# Patient Record
Sex: Male | Born: 1950 | Race: White | Hispanic: No | Smoking: Never smoker
Health system: Southern US, Community
[De-identification: ages and names within clinical notes are randomized; demographics above are authoritative.]

## PROBLEM LIST (undated history)

## (undated) DIAGNOSIS — C679 Malignant neoplasm of bladder, unspecified: Secondary | ICD-10-CM

## (undated) DIAGNOSIS — I10 Essential (primary) hypertension: Secondary | ICD-10-CM

## (undated) DIAGNOSIS — I219 Acute myocardial infarction, unspecified: Secondary | ICD-10-CM

## (undated) DIAGNOSIS — I719 Aortic aneurysm of unspecified site, without rupture: Secondary | ICD-10-CM

## (undated) DIAGNOSIS — G4730 Sleep apnea, unspecified: Secondary | ICD-10-CM

## (undated) HISTORY — PX: MOUTH SURGERY: SHX715

---

## 2001-06-20 ENCOUNTER — Ambulatory Visit (HOSPITAL_COMMUNITY): Admission: RE | Admit: 2001-06-20 | Discharge: 2001-06-20 | Payer: Self-pay | Admitting: Internal Medicine

## 2001-06-20 ENCOUNTER — Encounter: Payer: Self-pay | Admitting: Internal Medicine

## 2001-08-29 HISTORY — PX: BACK SURGERY: SHX140

## 2002-09-23 ENCOUNTER — Encounter: Payer: Self-pay | Admitting: Internal Medicine

## 2002-09-23 ENCOUNTER — Ambulatory Visit (HOSPITAL_COMMUNITY): Admission: RE | Admit: 2002-09-23 | Discharge: 2002-09-23 | Payer: Self-pay | Admitting: Internal Medicine

## 2004-05-19 ENCOUNTER — Inpatient Hospital Stay (HOSPITAL_COMMUNITY): Admission: RE | Admit: 2004-05-19 | Discharge: 2004-05-21 | Payer: Self-pay | Admitting: Neurosurgery

## 2004-05-26 ENCOUNTER — Inpatient Hospital Stay (HOSPITAL_COMMUNITY): Admission: EM | Admit: 2004-05-26 | Discharge: 2004-05-29 | Payer: Self-pay | Admitting: Emergency Medicine

## 2004-05-26 ENCOUNTER — Encounter: Payer: Self-pay | Admitting: *Deleted

## 2005-02-07 ENCOUNTER — Encounter: Admission: RE | Admit: 2005-02-07 | Discharge: 2005-02-07 | Payer: Self-pay | Admitting: Neurosurgery

## 2006-06-12 IMAGING — CR DG CHEST 2V
2 series · 2 of 2 positions shown · non-contrast
Comparison: none

CLINICAL DATA: Hypotension. 
 TWO VIEW CHEST
 No previous for comparison. 
 The heart size and mediastinal contours are normal. The lungs are clear. The visualized skeleton is unremarkable.

 IMPRESSION
 No active disease.

[view not recorded (1 of 2)]
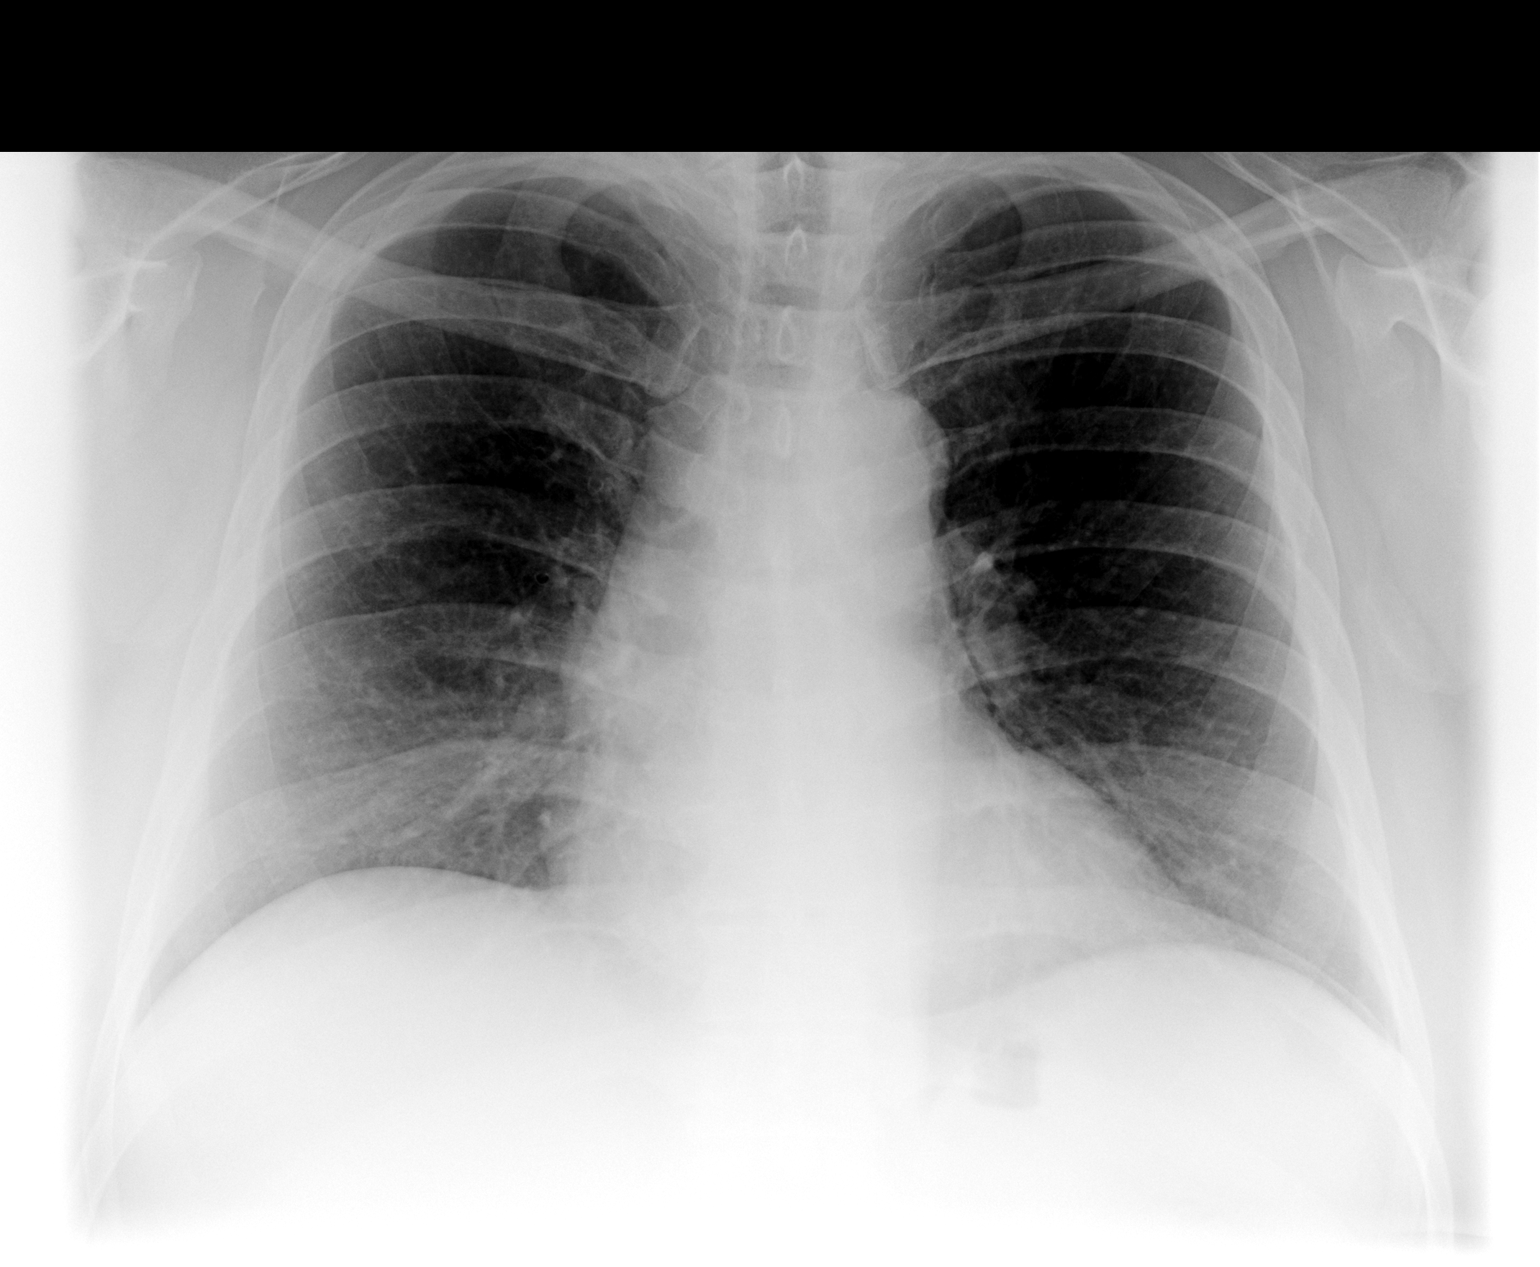

[view not recorded (2 of 2)]
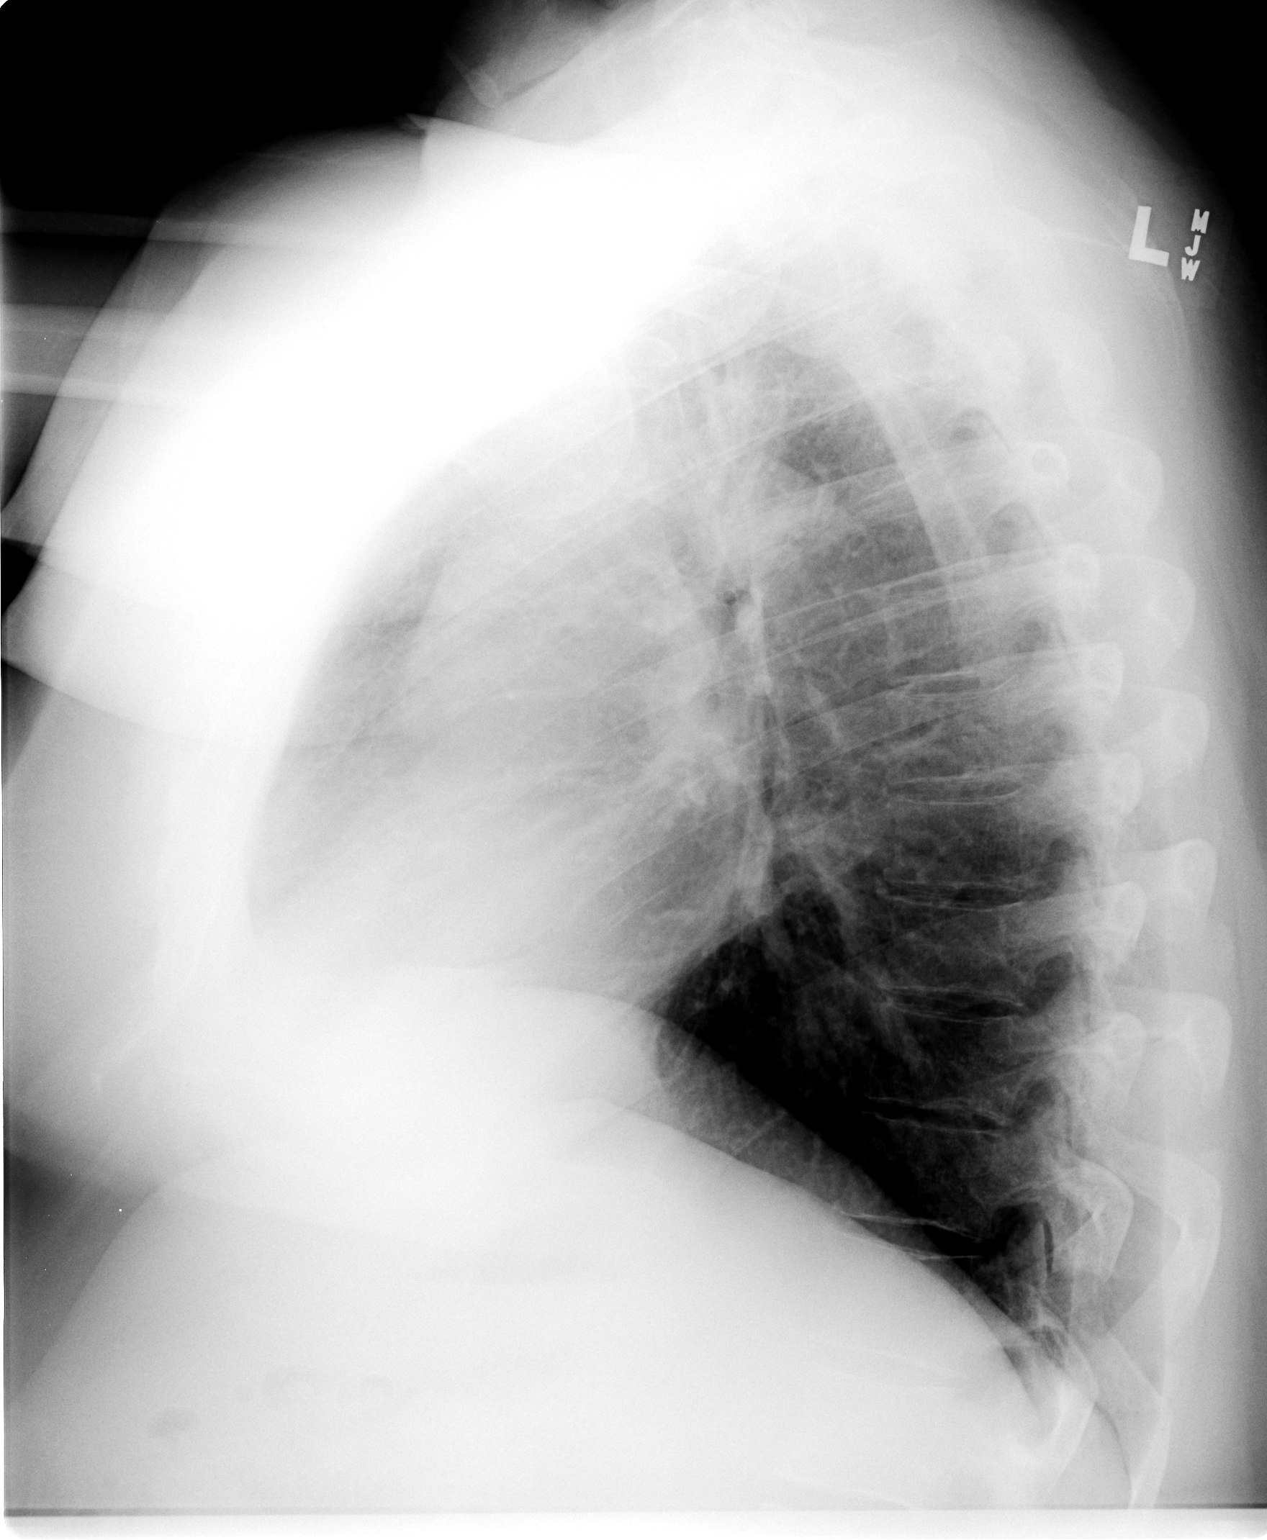

[2 of 2 positions shown; findings below may reference images not displayed]

## 2008-06-22 ENCOUNTER — Observation Stay (HOSPITAL_COMMUNITY): Admission: EM | Admit: 2008-06-22 | Discharge: 2008-06-24 | Payer: Self-pay | Admitting: Emergency Medicine

## 2008-06-23 ENCOUNTER — Ambulatory Visit: Payer: Self-pay | Admitting: Psychiatry

## 2008-06-24 ENCOUNTER — Inpatient Hospital Stay (HOSPITAL_COMMUNITY): Admission: AD | Admit: 2008-06-24 | Discharge: 2008-06-24 | Payer: Self-pay | Admitting: Psychiatry

## 2009-03-04 ENCOUNTER — Ambulatory Visit (HOSPITAL_COMMUNITY): Admission: RE | Admit: 2009-03-04 | Discharge: 2009-03-04 | Payer: Self-pay | Admitting: Internal Medicine

## 2010-09-19 ENCOUNTER — Encounter: Payer: Self-pay | Admitting: Internal Medicine

## 2011-01-04 ENCOUNTER — Ambulatory Visit (INDEPENDENT_AMBULATORY_CARE_PROVIDER_SITE_OTHER): Payer: Medicare HMO | Admitting: Internal Medicine

## 2011-01-04 DIAGNOSIS — R195 Other fecal abnormalities: Secondary | ICD-10-CM

## 2011-01-11 NOTE — H&P (Signed)
NAMEMarland Kitchen  LEDELL, CODRINGTON NO.:  [id number]   MEDICAL RECORD NO.:  [id number]          PATIENT TYPE:  INP   LOCATION:  IC08                          FACILITY:  [facility]   PHYSICIAN:  Osvaldo Shipper, MD     DATE OF BIRTH:  May 08, 1951   DATE OF ADMISSION:  06/22/2008  DATE OF DISCHARGE:  LH                              HISTORY & PHYSICAL   PRIMARY CARE PHYSICIAN:  Madelin Rear. Sherwood Gambler, M.D.   ADMISSION DIAGNOSES:  1. Intentional drug overdose with Ambien and Hydrocodone and      acetaminophen.  2. Altered mental status as a result of #1.  3. History of hypertension.  4. History of depression, not taking his medications.   CHIEF COMPLAINT:  I took sleeping pills.   HISTORY OF PRESENT ILLNESS:  The patient is a 60 year old Caucasian male  who has a past medical history of hypertension who has been unemployed  for a year and has also been depressed and says that he has been trying  to take his life with medications over the past one or two months.  According to his wife who is with him, the patient has been talking  about taking his life for the past few months as well.  He has not  sought any attention for this.  He was prescribed some Lexapro and Xanax  by his PMD but he has not been taking them.   Apparently at six o'clock this evening he chewed 4-5 tablets of Ambien  CR and swallowed them and he also chewed 3-4 tablets of Hydrocodone  5/500 and then swallowed them.  He states he has been doing the same for  the last 3-4 nights.  He said he wanted to go to sleep.  He is  complaining of some nonspecific abdominal pain in the lower abdomen  which comes and goes but denies any nausea or vomiting.   He apparently is also in the process of undergoing divorce from his wife  and he also has other stressors in his life.   MEDICATIONS AT HOME:  1. Enalapril 10 mg daily.  2. Ambien CR 12.5 mg as needed.  This prescription was filled on      October 17 with 30 tablets and the  bottle is empty.  3. The bottle of Hydrocodone is actually an expired bottle.  4. He is also supposed to be taking Lexapro and Xanax but he is not      taking them at this time.  5. He is on 162 mg of aspirin daily.  6. He is on multivitamins daily.   ALLERGIES:  No known drug allergies.   PAST MEDICAL HISTORY:  1. Positive for hypertension.  2. Depression.   PAST SURGICAL HISTORY:  Cervical spine fusion surgery.  This is  complicated by cervical hematomas which were retractably surgically  evacuated.   SOCIAL HISTORY:  He lives in [place].  He is in the process of  divorcing from his wife.  He does not smoke.  No alcohol use.  No  illicit drug use.  Currently unemployed.  He was laid off about a year  ago.   FAMILY HISTORY:  Positive for lung cancer, heart disease, and diabetes.   REVIEW OF SYSTEMS:  Difficult to do because the patient does not stay  awake much.   PHYSICAL EXAMINATION:  VITAL SIGNS:  Temperature 98.6, blood pressure  132/68, heart rate in the 80s, respiratory rate 16, saturation 99% on 2  L via nasal cannula.  GENERAL:  Obese white male, very somnolent, arouseable, can converse a  little bit, but then goes right back to sleep.  HEENT:  His pupils are equally sized, normal, not pinpoint.  No pallor,  no icterus.  Oral mucous membranes moist.  No oral lesions are noted.  NECK:  Soft and supple.  No thyromegaly is appreciated.  LUNGS:  Clear to auscultation bilaterally.  No wheezing, rales, or  rhonchi.  CARDIOVASCULAR:  S1/S2 is normal, regular.  No murmurs appreciated.  No  S3, S4, no rubs, no bruits.  ABDOMEN:  Soft.  At times he mentions some tenderness in the lower  abdomen, but real examination is not very remarkable.  Bowel sounds are  present.  EXTREMITIES:  Show no edema.  Peripheral pulses are palpable.  NEUROLOGIC:  He is very somnolent, arouseable.  No focal neurological  deficits are present.  MUSCULOSKELETAL:  Exam was unremarkable.    LABORATORY DATA:  His CBC is unremarkable.  PTT is normal.  CMET showed  a glucose of 134, AST of 41.  LFTs are normal.  His ammonia level was  20.  Lactic acid normal.  Lipase was 28.  His acetaminophen level which  was a 1-hour draw, was less than 10.  Other labs pending at this time.  Urine drug screen was positive for opiates.  Alcohol level less than 5.  UA shows more than 1.030 specific gravity, otherwise unremarkable.   No imaging studies have been done.   ASSESSMENT:  This is a 60 year old Caucasian male who has taken about 30  tablets of Ambien over the period of the last seven days and he has also  taken expired pills of Hydrocodone.  Quantity is unknown but the bottle  had originally about 30 pills.  The pill strength was 5/500.  This is an  intentional drug overdose.  The patient has been having suicidal  ideation for the past few months.  He also has a history of depression.   PLAN:  1. Drug overdose with benzos and opiates and possibly even Tylenol.      We will observe him in the ICU.  He is very somnolent.  ABG will be      checked.  ED physician has ordered Narcan drip though his pupils      are not really pinpoint.  I think his somnolence is likely a result      of benzos rather than the opiates.  Supportive measures will be      provided.  IV fluids will be given.  If he becomes apneic or does      not improve and if his ABG does not look too good he may have to be      intubated.  2. Depression with suicidal ideations.  Once he is over his acute      illness, he will need to be evaluated by a psychiatrist in an      inpatient setting.  3. Hypertension.  He is on enalapril which we will hold for now.  4. His nonspecific abdominal pain  will be evaluated using acute      abdominal series.  He could be having some cramps, maybe as a      result of the charcoal and the medications he is taking.  If his      pain does not improve or gets worse, other imaging studies may  also      be done.   DVT prophylaxis will be initiated.   Further management decisions will depend on the results of further  testing and patient's response to treatment.      Osvaldo Shipper, MD  Electronically Signed     GK/MEDQ  D:  06/22/2008  T:  06/23/2008  Job:  [id number]   cc:   Madelin Rear. Sherwood Gambler, MD  Fax: [phone]

## 2011-01-11 NOTE — Discharge Summary (Signed)
NAMEMarland Kitchen  Rodney Moreno, Rodney Moreno NO.:  [id number]   MEDICAL RECORD NO.:  [id number]          PATIENT TYPE:  INP   LOCATION:  IC08                          FACILITY:  [facility]   PHYSICIAN:  Osvaldo Shipper, MD     DATE OF BIRTH:  1950/11/06   DATE OF ADMISSION:  06/22/2008  DATE OF DISCHARGE:  10/27/2009LH                               DISCHARGE SUMMARY   PRIMARY CARE PHYSICIAN:  Madelin Rear. Sherwood Gambler, MD   DISCHARGE DIAGNOSES:  1. Intentional drug overdose with Ambien and Vicodin for suicide      attempt.  2. Suicidal ideation for the past two months.  3. Depression.  4. Transient abdominal pain, resolved.  5. History of hypertension.   HISTORY OF PRESENT ILLNESS:  Please review my H&P dictated yesterday for  details regarding the patient's present illness.   BRIEF HOSPITAL COURSE:  Briefly, this is a 60 year old Caucasian male  who was brought into the ER after he took about five tablets of Ambien  and 4-5 tablets of Vicodin.  Later, it became apparent that he had taken  about 30 tablets of Ambien in a course of 7 days.  The patient's wife  was also with him as we evaluated the patient, and apparently the  patient has been talking about taking his own life for the past couple  of months.  He has not sought any attention for this problem.  He has  been very stressed because of employment.  When the patient came here he  was very somnolent.  He was given Narcan, he was put on Narcan drip.  He  was monitored via ICU.  He started to wake up, and this morning he was  already getting much better.  He was also having some abdominal pain  which has improved.  He was thought to require inpatient psychiatric  assessment, so ACT team was called and apparently they have secured a  bed at [facility].  The patient has been treated for  the last 12 hours.  His vital signs show that his blood pressure is  running a little bit elevated throughout his course.  He has not been  on  his antihypertensives for a couple of days.  So, we will give him one  dose of Enalapril before he leaves.  His blood work has not shown any  significant abnormalities.   Because of the abdominal pain, we obtained acute abdominal series which  suggest possible mild ileus.  His symptoms have resolved, so no further  infections required.  Patient tolerated PO diet.   The patient wanted to actually go home.  I explained to him that doing  so without psychiatric assessment could be dangerous.  Next time, the  manifestations could be more severe.  So, the patient agrees to patient  assessment at this time.   CURRENT MEDICATIONS:  1. Lovenox for DVT prophylaxis.  2. Protonix daily for stress ulcer prophylaxis.   HOME MEDICATIONS:  1. Enalapril 10 mg daily.  2. Ambien ER 12.5 mg p.r.n.  3. Hydrochlorothiazide.  4. Supposed  to be taking Lexapro and Xanax, but has not been taking      them.  5. Aspirin 162 mg.  6. Multivitamin daily.   DISPOSITION:  Discharge instructions, etc, to be deferred to when the  patient goes home from [facility].   CONDITION ON DISCHARGE:  At this time, he is stable to be transferred to  [facility].      Osvaldo Shipper, MD  Electronically Signed     GK/MEDQ  D:  06/23/2008  T:  06/24/2008  Job:  [id number]   cc:   Madelin Rear. Sherwood Gambler, MD  Fax: [phone]

## 2011-01-11 NOTE — H&P (Signed)
NAMEMarland Kitchen  CHRISTINO, MCGLINCHEY NO.:  [id number]   MEDICAL RECORD NO.:  [id number]          PATIENT TYPE:  IPS   LOCATION:  0506                          [facility]   PHYSICIAN:  Geoffery Lyons, M.D.      DATE OF BIRTH:  1951/02/06   DATE OF ADMISSION:  06/24/2008  DATE OF DISCHARGE:                       PSYCHIATRIC ADMISSION ASSESSMENT   A 60 year old male involuntarily committed on June 23, 2008.   HISTORY OF PRESENT ILLNESS:  The patient is here on petition papers that  state that patient overdosed on a small amounts of his medications.  He  does state that he had chewed approximately 3 of his Ambien and had  taken some hydrocodone for recent dental procedure trying to get himself  to sleep.  Had no intention that he was trying to harm himself.  He  apparently was found by his wife who got very concerned.  She had found  him unresponsive and was taken to the Emergency Department for  evaluation.  The patient does have some stressors.  He is currently  unemployed.  There seems to be some marital conflict noted in the chart  but again denies any suicidal thoughts and states he has reason to live  for his children who he states he dearly loves.  Denies any alcohol or  substance use.   PAST PSYCHIATRIC HISTORY:  First admission to [facility].  No other hospitalizations.  No prior history of any suicide attempts.   SOCIAL HISTORY:  A 60 year old male married 27 years.  He lives in  [place].  Currently unemployed.   FAMILY HISTORY:  None.   ALCOHOL OR DRUG HISTORY:  No known alcohol or drug use.  Primary care  Aqeel Norgaard is Dr. Sherwood Gambler.   MEDICAL PROBLEMS:  Hypertension.  The patient reports recently he had a  physical where labs were within normal limits.  Blood pressure was under  better control with a weight loss and managing his diet.   MEDICATIONS:  1. Still currently on enalapril 10 mg daily.  2. Ambien CR for sleep.  3. Aspirin two 81 mg  daily prescribed by Dr. Sherwood Gambler.   DRUG ALLERGIES:  No known allergies.   PHYSICAL EXAMINATION:  GENERAL:  He is a middle-aged male.  He appears  well-nourished in no acute distress.  Fully assessed at [facility].  No significant findings on his exam.  Initially states the  patient was uncooperative and did receive some Narcan.  VITAL SIGNS:  Temperature of 98.1, 70 heart rate, 18 respirations, blood  pressure is 144/88, 246 pounds, 6 feet tall.   Urine drug screen is positive for opiates.  Alcohol level less than 5.  Urinalysis is negative.  Ammonia level was 20.  SGOT is 41.  Salicylate  level less than 4.  His acetaminophen level was less than 10.   MENTAL STATUS EXAM:  He is fully alert, cooperative, casually dressed,  good eye contact.  Speech is clear, normal pace, and tone.  The  patient's mood is neutral.  The patient's affect is pleasant, euthymic.  Does  not appear depressed.  Thought processes are coherent and goal  directed.  Denies any suicidal or homicidal thoughts.  No delusional  statements.  His cognitive function is oriented x3.  Memory is intact.  Judgment and insight appear to be fair.  Feels he could benefit from  some individual counseling.   AXIS I:  Major depressive disorder, single episode.  AXIS II:  Deferred.  AXIS III:  Hypertension.  AXIS IV:  Problems with occupation, possible other psychosocial  problems.  AXIS V:  Current is 40-45.   Plans to contract for safety, stabilize mood and thinking.  We will  contact wife for concerns and any background information.  The patient  may benefit from the IOP Program or some individual counseling.  The  patient will need to follow up with his family doctor and continue with  his current weight loss and exercise.  His tentative length of stay at  this time is 2-3 days.      Landry Corporal, N.P.      Geoffery Lyons, M.D.  Electronically Signed    JO/MEDQ  D:  06/24/2008  T:  06/24/2008  Job:   [id number]

## 2011-01-11 NOTE — Discharge Summary (Signed)
NAMEMarland Kitchen  Rodney Moreno, Rodney Moreno NO.:  [id number]   MEDICAL RECORD NO.:  [id number]          PATIENT TYPE:  IPS   LOCATION:  0506                          [facility]   PHYSICIAN:  Geoffery Lyons, M.D.      DATE OF BIRTH:  03/13/1951   DATE OF ADMISSION:  06/24/2008  DATE OF DISCHARGE:  06/24/2008                               DISCHARGE SUMMARY   IDENTIFICATION ON ADMISSION:  A 60 year old male who was involuntarily  comitted on June 23, 2008.   HISTORY OF PRESENT ILLNESS:  This is a less than 24-hour observation of  a patient who overdosed on Ambien and hydrocodone.  Again the patient is  here on petition that states the patient did overdose in small amounts  of his medications.  He was medically cleared after being admitted to  [facility].  The patient denies any suicidal thoughts stating  he had only taken the medication to help him sleep.  Records do indicate  that the patient initially was found by his wife.  Apparently, the  patient was unresponsive at home and was again assessed in the emergency  department and admitted briefly in the hospital for stabilization.  His  medications were prescribed by his family doctor and the hydrocodone was  prescribed for a dental procedure that he recently had.   PAST PSYCHIATRIC HISTORY:  This is the first admission to [facility].  Has had no other psychiatric hospitalizations.  Has no  current outpatient mental health treatment.   SOCIAL HISTORY:  This is a 60 year old married male, married for 27  years who lives in [place] with his wife, currently unemployed.   FAMILY HISTORY:  None.   ALCOHOL AND DRUG HISTORY:  Denies any alcohol or drug use.   PRIMARY CARE Benny Henrie:  Dr. Sherwood Gambler.   MEDICAL PROBLEMS:  The patient is being treated for hypertension.   MEDICATION:  1. The patient has been on enalapril 10 mg at home.  2. Ambien CR for sleep.  3. Has been taking aspirin 81 mg 2 daily.   DRUG  ALLERGIES:  No known allergies.   PHYSICAL EXAM:  CONSTITUTIONAL:  Again a middle-aged male who appears  well nourished, cooperative in no acute distress.   His laboratory data shows a urine drug screen that is positive for  opiates.  Urinalysis is negative.  Salicylate less than 4.  Acetaminophen level less than 10.  Alcohol level less than 5.  SGOT  mildly elevated at 41.   MENTAL STATUS EXAM:  He is fully alert, cooperative, good eye contact,  casually dressed.  Speech is clear, articulate.  Provides a good  history.  The patient's mood is neutral.  The patient's affect is  pleasant.  Does not appear to be overtly depressed or anxious.  Thought  processes are coherent and goal-directed.  His judgment and insight are  fair.  Memory is intact.   INITIAL DIAGNOSES:  AXIS I:  Major depressive disorder NOS.  AXIS II:  Deferred.  AXIS III:  Hypertension.  AXIS IV:  Problems with  occupation, psychosocial problems.  AXIS V:  Current is 45.   HOSPITAL COURSE:  His records were reviewed.  We spoke with the patient  about his mood and significant stressors that prompted the overdose.  We  contacted his wife for any concerns and support.  We discussed the  possibility of some on individual therapy after discharge to help with  any stress the patient was experiencing.  We did resume his  antihypertensive and his aspirin.  We felt with the wife's feeling very  comfortable with the patient's discharge that we felt safe that the  patient could go home and to follow up with his primary care Rodney Moreno.  The patient had stated that he did not want to take any more of the  Ambien.   DISCHARGE:  The patient will follow up with his primary care Rodney Moreno  for review of his Ambien.  He is also to resume his medications from Dr.  Sherwood Gambler and will be discharged to home.   DISCHARGE DIAGNOSIS:  AXIS I:  Depressive disorder NOS.  AXIS II:  Deferred.  AXIS III:  Hypertension.  AXIS IV: Possible  psychosocial problems with problems with occupation.  AXIS V:  Current is 55.      Landry Corporal, N.P.      Geoffery Lyons, M.D.  Electronically Signed    JO/MEDQ  D:  06/24/2008  T:  06/24/2008  Job:  [id number]

## 2011-01-14 NOTE — Op Note (Signed)
NAMETRENDON, ZARING NO.:  [id number]   MEDICAL RECORD NO.:  [id number]          PATIENT TYPE:  INP   LOCATION:  2899                         FACILITY:  [facility]   PHYSICIAN:  Hilda Lias, M.D.   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   PROCEDURE:  Anterior 5-6, 6/7 diskectomies with decompression of the spinal  cord, foraminotomy, interbody fusion with allograft, plates from C5 to C7,  microscope.   SURGEON:  Hilda Lias, M.D.   ASSISTANT:  Payton Doughty, M.D.   CLINICAL HISTORY:  The patient was admitted because of neck pain in relation  to weakness of both upper extremities.  X-ray show severe stenosis at the  level of 5-6 and 6-7.  Surgery was advised.  The risks were explained in the  history and physical.   PROCEDURE:  The patient was taken to the OR and the left side of the neck  was prepped with Betadine.  Incision was made through the skin and platysma  down to the cervical spine.  We found 2 areas of anterior osteophytes which  were removed.  The difficult part of the procedure was to visualize the  level.  We did at least 5 x-rays, tried to find out, and finally the last x-  ray showed that at least the needle was at the level of 4-5.  This was read  by the radiologist, Dr. Judie Petit. Ruel Favors.  Then we removed the osteophytes at  5-6 and 6-7.  The anterior ligament was also opened.  We brought the  microscope into the area.  At the level of 5-6, he had a severe case of  degenerative disk disease up to the point there was no soft disk at all.  Then with the microscope, we drilled the posterior aspects of 5 and 6 and we  opened a thick calcified ligament.  The patient has quite a bit of  spondylosis with some herniated disk  going bilaterally.  Removal was done  and decompression of the C6 nerve root was done bilaterally.  Then the same  procedure was done at the level of C7, where we found quite a bit of  stenosis.  The dura mater was flat and pushed backward.  After having good  decompression, the endplates at 5, 6 and 7 were drilled and 2 pieces of  allograft with 7-mm height were inserted followed by a plate using 5 screws.  Because of being unable to see 5-6, we decided not to do any x-ray.  Nevertheless, we have a good space between the posterior aspect of the bone  graft and the spinal cord.  From then on, the area was irrigated and  hemostasis was done.  A drain was left and the wound was closed with Vicryl  and Steri-Strips.  The patient is able to be going to his room.  EB/MEDQ  D:  05/19/2004  T:  05/20/2004  Job:  [id number]

## 2011-01-14 NOTE — Op Note (Signed)
NAMEQUENCY, TOBER NO.:  [id number]   MEDICAL RECORD NO.:  [id number]          PATIENT TYPE:  INP   LOCATION:  3113                         FACILITY:  [facility]   PHYSICIAN:  Hilda Lias, M.D.   DATE OF BIRTH:  Apr 16, 1951   DATE OF PROCEDURE:  05/27/2004  DATE OF DISCHARGE:                                 OPERATIVE REPORT   PREOPERATIVE DIAGNOSIS:  Cervical hematoma.   POSTOPERATIVE DIAGNOSIS:  Cervical hematoma.   OPERATION PERFORMED:  Evacuation of cervical hematoma.  Microscope.   SURGEON:  Hilda Lias, M.D.   ANESTHESIA:   INDICATIONS FOR PROCEDURE:  Mr. Foot is a gentleman who a week ago  underwent surgery at the level of 5-6 and 6-7.  He is over 300 pounds.  He  has a drain which was kept for 48 hours.  After that, the patient was  stable.  He went home and he had been doing fairly well until today when he  was in the [place] and he noticed swelling of the neck.  He was taken to  [facility] and from there we transferred him to [facility].  Indeed we could see some fresh bleeding coming from the neck.  CT  scan showed hematoma in the cervical area.  Because of that, we decided to  go ahead with surgery immediately.   DESCRIPTION OF PROCEDURE:  The patient was taken to the operating room and  he was intubated which was a bit difficult.  Then the neck was prepped with  Betadine.  The previous incision was opened and immediately we found quite a  bit of clot in front of and behind the sternocleidomastoid muscle.  There  was some fresh blood which was also removed.  There was no major point of  bleeding.  At the end, we irrigated the area.  We tried to dry it out.  Nevertheless there was still some oozing coming from the longus colli  muscle.  Hemostasis was done with bipolar cautery.  We waited at least 45  minutes, waiting for more hemostasis and we found that indeed there was an  area of small amount of bleeding coming from 6-7  vertebral body where he had  osteophyte right below the plate.  Then using Avitene, hemostasis was  accomplished.  Again we had good hemostasis.  Nevertheless we waited at  least 20 minutes and we irrigated with a copious amount of saline just to be  sure that there was no more bleeding.  Then Jackson-Pratt drain used to  follow the previous tract was inserted into the prevertebral area.  From  then on, the wound was closed with Vicryl and Steri-Strips.  At the end of  the procedure, the patient was awake, following commands, with a normal  strength.  He will be going to the intensive care unit.       EB/MEDQ  D:  05/27/2004  T:  05/27/2004  Job:  [id number]

## 2011-01-14 NOTE — Consult Note (Signed)
NAMEMarland Kitchen  GUNNAR, HEREFORD NO.:  [id number]   MEDICAL RECORD NO.:  [id number]          PATIENT TYPE:  INP   LOCATION:  3029                         FACILITY:  [facility]   PHYSICIAN:  Leighton Roach. Truett Perna, M.D. DATE OF BIRTH:  10/27/1950   DATE OF CONSULTATION:  05/27/2004  DATE OF DISCHARGE:                                   CONSULTATION   HEMATOLOGY CONSULTATION NOTE:   REFERRING PHYSICIAN:  Dr. Jeral Fruit.   PATIENT IDENTIFICATION:  Mr. Neville is a 60 year old, status post cervical  spine surgery on 9/21.  He presented to the emergency room on 9/28 with  swelling and bleeding at the left neck.   HISTORY OF PRESENT ILLNESS:  Mr. Rupe was referred to Dr. Jeral Fruit with neck  and shoulder pain.  An MRI scan showed cervical stenosis.   He was taken to the operating room on 9/21 and underwent an anterior C5-6  and C6-7 diskectomy with a decompression of the spinal cord, foraminotomy  and a fusion procedure between C5 and C7.   He reports improvement in the pain following surgery.  He was well until  9/28 when he noted the abrupt onset of swelling in the left neck and  bleeding from the neck dressing.  He was evaluated in the [facility]  Emergency Room and then referred to [facility].  A CT scan of the neck revealed a  hematoma in the left neck involving the sternocleidomastoid muscle and left  paralaryngeal/esophagus region.  The hematoma measured 6.5 x 5 cm.   He was taken to the operating room on 9/29 and clot was noted surrounding  the sternocleidomastoid muscle.  No major point of bleeding was noted.  Oozing along the musculature was cauterized.  A small amount of bleeding was  noted at the C6-7 vertebral body.  This was controlled with Avitene.  The  area was observed for another 20 minutes and no further bleeding was noted.  A Jackson-Pratt drain was placed.   Mr. Thrall reports improvement in his pain and arm movement following his  surgery earlier today.  He was in a warm tub  bath immediately prior to the  onset of left neck swelling and bleeding on 9/28.  He has no previous  history of bleeding.  He reports no bleeding following multiple tooth  extractions in the past or with hemorrhoid surgeries.  There is no family  history of a bleeding disorder.  He takes aspirin daily.   He reports being placed on an anti-inflammatory medication by Dr. Jeral Fruit  for approximately one week prior to the initial surgery.   PAST MEDICAL HISTORY:  1.  Hypertension.  2.  Sleep apnea.   PAST SURGICAL HISTORY:  1.  Hemorrhoid surgery on two occasions, the last was two to three years      ago.  2.  Multiple tooth extractions.   ALLERGIES:  No known drug allergies.   ADMISSION MEDICATIONS:  1.  Enalapril 10 mg every day.  2.  Aspirin 81 mg every day.  3.  Hydrocodone p.r.n.  4.  Naprosyn.  FAMILY HISTORY:  No family history of a bleeding disorder to his knowledge.  His daughter had a tonsillectomy at age 16 and developed bleeding at the  surgical site approximately six days after surgery.   SOCIAL HISTORY:  He works in an office occupation.  He lives with his wife  in [place].  He does not use tobacco.  He drinks alcohol on rare  occasions.  He has no transfusion history.   REVIEW OF SYSTEMS:  Otherwise unremarkable.   PHYSICAL EXAMINATION:  HEENT:  The oropharynx is without thrush.  No  bleeding.  NECK:  The left neck dressing is dry.  No bleeding at the JP drain site.  No  apparent neck edema.  There is a small amount of serosanguineous fluid in  the JP drain.  ABDOMEN:  Soft and nontender.  No organomegaly.  EXTREMITIES:  He moves both arms.  No leg edema.  No ecchymoses or petechiae  noted over the extremities.   LABORATORY DATA:  From 9/28, hemoglobin 14.8, platelets 375,000, white count  10.5.  ANC 6.4.  PT 13.3, PTT 29.  BUN 17, creatinine 1.0.   IMPRESSION/RECOMMENDATIONS:  Mr. Garber is a 60 year old who underwent  cervical spine surgery on 9/21 for  treatment of cervical stenosis.   He presented on 9/28 with a left neck hematoma and bleeding at the surgical  wound.   I have a low suspicion for an inherited bleeding disorder.  The PT and PTT  are normal and he has no history of excessive bleeding following tooth  extractions and hemorrhoid surgery.   Though his presentation appears unusual, the bleeding is most likely  surgically related.   He may have a degree of acquired platelet dysfunction secondary to aspirin  and nonsteroidal anti-inflammatory medications.   The differential diagnosis include von Willebrand's disease and much less  likely a rare inherited coagulation factor deficiency such as factor XIII  deficiency.   He should be observed closely for recurrent bleeding.   I will obtain a von Willebrand panel and bleeding time.       GBS/MEDQ  D:  05/27/2004  T:  05/28/2004  Job:  [id number]   cc:   Hilda Lias, M.D.  [address]  [place], [place] [address]  Fax: [phone]

## 2011-01-14 NOTE — H&P (Signed)
NAME:  COREON, SIMKINS NO.:  [id number]   MEDICAL RECORD NO.:  [id number]          PATIENT TYPE:  INP   LOCATION:  NA                           FACILITY:  [facility]   PHYSICIAN:  Hilda Lias, M.D.   DATE OF BIRTH:  [birth year]   DATE OF ADMISSION:  DATE OF DISCHARGE:                                HISTORY & PHYSICAL   Mr. Wanke is a gentleman who was seen by me in my office a few weeks ago  because of neck pain for several weeks.  The pain is mostly localized to the  shoulders, going to the chest wall.  He denies any weakness.  He also denies  any sensory changes, although once in awhile when he extends the neck, he  feels like an electrical sensation going from the neck down.  He denies any  problem with the lower extremities.  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  He had MRI and because of the findings, he wanted to proceed  with surgery.   PAST MEDICAL HISTORY:  Negative.   He is not allergic to any medication.   SOCIAL HISTORY:  Negative.   FAMILY HISTORY:  Father died of heart disease.   REVIEW OF SYSTEMS:  Positive for arm weakness, shoulder pain, blackout, skin  cancer, chest pain, high blood pressure.   PHYSICAL EXAMINATION:  HEENT:  Head, ears, nose, and throat normal.  NECK:  He is able to flex but extension produces some discomfort.  CHEST:  Lungs clear.  CARDIAC:  Heart sounds normal.  ABDOMEN:  Normal.  EXTREMITIES:  Normal pulses.  NEUROLOGIC:  Mental status normal.  Cranial nerves normal.  Strength normal.  Sensation normal.  Reflexes 1+, no Babinski.  The strength showed that I can  break easily both biceps and both wrist extensor with a normal deltoid and  normal triceps.   Cervical spine x-ray and MRI shows that he has a severe case of cervical  stenosis with the canal being 5  mm at the level of C6-7 and also quite a bit  of narrowing at the level of C5-6.   CLINICAL IMPRESSION:  Cervical stenosis C5-6, C6-7.   RECOMMENDATIONS:  The patient wants to proceed with surgery.  This procedure  would be at two-level anterior cervical diskectomy, and the risks are  infection; damage to the vertebral arteries, esophagus, trachea; stroke;  need for further surgery.       EB/MEDQ  D:  05/19/2004  T:  05/19/2004  Job:  [id number]

## 2011-01-14 NOTE — H&P (Signed)
NAMEMarland Kitchen  FARES, RAMTHUN NO.:  [id number]   MEDICAL RECORD NO.:  [id number]          PATIENT TYPE:  INP   LOCATION:  1824                         FACILITY:  [facility]   PHYSICIAN:  Hilda Lias, M.D.   DATE OF BIRTH:  08-07-1951   DATE OF ADMISSION:  05/26/2004  DATE OF DISCHARGE:                                HISTORY & PHYSICAL   Rodney Moreno is a gentleman who, on May 19, 2004, underwent __________  5-  6, 6-7 diskectomy and decompression of spinal cord with graft.  The patient  __________ , but we found that he has a long history of sleep apnea.  He was  seen by the pulmonologist who talked to him about using CPAP.  The patient  eventually was discharged on May 21, 2004.  He was doing really well.  He was supposed to be taking some pain medication and muscle relaxant.  The  patient did well but today about 10, I got a telephone call from [facility]  Emergency Room to let me know that Rodney Moreno was in the emergency room.  The  history was that he was in a [place] and he started noticing some swelling  of the neck.  He had a cervical spine x-ray which showed some swelling.  The  emergency room physician told me that it probably was safe to send him home,  but we agreed for the patient to come to [facility].  To my  surprise, when I saw  him in the emergency room, he was bleeding fresh  bright red blood from the left with quite a bit of hematoma.  A CT scan was  done.  Because of the findings, he is being admitted for immediate emergency  evacuation of a cervical hematoma.   PAST MEDICAL HISTORY:  1.  History of hypertension.  2.  Sleep apnea.  3.  Fusion a week ago.   SOCIAL HISTORY:  Negative.   FAMILY HISTORY:  Unremarkable.   PHYSICAL EXAMINATION:  GENERAL:  The patient is not in any acute distress  but he is really scared about what is going on.  NECK:  In the neck obviously, he has a large swelling with the trachea  mildly displaced to  the right side.  LUNGS:  Clear.  HEART:  Sounds normal.  ABDOMEN:  Normal.  EXTREMITIES:  Normal pulses.  NEURO:  Mental status is normal.  Cranial nerves normal.  Strength normal.  Sensation normal.   The CT scan showed that indeed he has a large hematoma right lobe of the  sternocleidomastoid muscle with __________  and some mild displacement of  the trachea.   CLINICAL IMPRESSION:  Cervical hematoma.   RECOMMENDATIONS:  I am going to take Rodney Moreno immediately for evacuation of  the hematoma.  I explained to the wife that I am really concerned because if  this happened today, I am really worried that there might be some bleeding  going on.  Nevertheless, since there is no airway obstruction we can wait  till the morning but I am  really concerned about the acute onset especially  one week after his previous surgery.  Because of that, I told him that it is  better to go ahead as soon as possible instead of delaying this.  The risk  of course are difficult intubation, unable to stop the bleeding, infection,  damage to the esophagus and the trachea.  The patient was seen by __________  and he is ready to go in the next few minutes.       EB/MEDQ  D:  05/27/2004  T:  05/27/2004  Job:  [id number]

## 2011-01-14 NOTE — Discharge Summary (Signed)
NAMEMarland Kitchen  CHADRIC, KIMBERLEY NO.:  [id number]   MEDICAL RECORD NO.:  [id number]          PATIENT TYPE:  INP   LOCATION:  3029                         FACILITY:  [facility]   PHYSICIAN:  Payton Doughty, M.D.      DATE OF BIRTH:  04-12-1951   DATE OF ADMISSION:  05/27/2004  DATE OF DISCHARGE:  05/29/2004                                 DISCHARGE SUMMARY   ADMITTING DIAGNOSIS:  Accumulation of hematoma after anterior cervical  decompression.   DISCHARGE DIAGNOSIS:  Accumulation of hematoma after anterior cervical  decompression.   OPERATIVE PROCEDURE:  Evacuation of hematoma by Dr. Hilda Lias.   COMPLICATIONS:  None.   DISCHARGE STATUS:  Alive and well.   BODY OF TEXT:  [age]-year-old gentleman whose history and physical is  recounted by Dr. Jeral Fruit.  He had an anterior cervical several days before  and developed a neck hematoma and having difficulty breathing.  He was  admitted and underwent evacuation of his hematoma.  Drain was placed.  He  was visited by hematology and did not identify any bleeding diatheses.  His  JP drainage has diminished to less than 5 mL a shift; drain was stopped  yesterday.  He is currently awake and alert with a patent airway, no  difficulty breathing or swallowing.   DISPOSITION:  He is being discharged home.   FOLLOWUP:  His followup will be with Dr. Jeral Fruit in a couple of weeks.       MWR/MEDQ  D:  05/29/2004  T:  05/30/2004  Job:  [id number]

## 2011-01-14 NOTE — Consult Note (Signed)
NAMEMarland Kitchen  Rodney Moreno, Rodney Moreno NO.:  [id number]   MEDICAL RECORD NO.:  [id number]          PATIENT TYPE:  INP   LOCATION:  3006                         FACILITY:  [facility]   PHYSICIAN:  Shan Levans, M.D. LHCDATE OF BIRTH:  1951/05/10   DATE OF CONSULTATION:  05/20/2004  DATE OF DISCHARGE:  05/21/2004                                   CONSULTATION   CHIEF COMPLAINT:  Evaluate for obstructive sleep apnea.   HISTORY OF PRESENT ILLNESS:  This is a 60 year old white male who was  admitted on May 19, 2004 for elective anterior cervical spine surgery  to correct C5-C6 spinal stenosis.  Postoperative overnight the patient has  been noted to have significant apneas with prolonged spells of apnea and  desaturation despite supplemental oxygen.  We are asked to assess for this.  The patient, in retrospect, notes for the past year he has had increasing  daytime hypersomnolence, increased snoring, witnessed apneas have increased,  increased somnolence to the point where he falls asleep while operating a  motor vehicle.  Notes increased memory loss, change in mood and affect.  He  goes to sleep at 10 p.m. and will often awake multiple times during the  night, cannot go back to sleep.  Denies any sinus complaints.  His weight  has been going up.  Denies any smoking history.  No reflux symptomatology.  Referred now for further evaluation.   PAST MEDICAL HISTORY:  Essentially negative.  Hypertension only.  No history  of cardiac disease, seizures, strokes, COPD, asthma, reflux disease.   SOCIAL HISTORY:  Patient is a nonsmoker.  Does not drink alcohol.   FAMILY HISTORY:  Noncontributory.   MEDICATIONS ON ADMISSION:  1.  Enalapril 10 mg daily.  2.  Aspirin 81 mg daily.  3.  Anti-inflammatory b.i.d.  4.  Naprosyn 500 mg.   PHYSICAL EXAMINATION:  VITAL SIGNS:  Blood pressure 166/89, pulse 82,  respirations 20, temperature 96.7, saturation 97% on 2 L.  GENERAL:  This is an obese  white male in no acute distress wearing a neck  brace with JP drains emanating from the neck and anterior chest wall.  CHEST:  Clear bilateral to auscultation and percussion.  There is no  evidence of wheeze or rhonchi.  CARDIAC:  Regular rate and rhythm with S3.  ABDOMEN:  Protuberant, nontender.  No organomegaly.  EXTREMITIES:  No clubbing, edema, or venous disease.  SKIN:  Clear.  HEENT:  Neck brace in place.  Oropharynx clear.   LABORATORY DATA:  Chest x-ray showed no active disease.  Hemoglobin 15.6,  white count 7.5.  Potassium 3.8, creatinine 0.9, blood sugar 115.   IMPRESSION:  Probable obstructive sleep apnea in a patient who is status  post C spine surgery for C5 and C6 spinal stenosis.  The sleep apnea is  exacerbated by recent general anesthesia.   RECOMMENDATIONS:  Begin Protonix 40 mg daily.  Trial of CPAP 10 cm of water  pressure h.s.  Continue oxygen therapy.  Check blood gas.  Arrange for sleep  study once the patient is out  of the hospital and will check with [facility] for the possibility of providing a CPAP device on discharge for  the patient to use at home prior to sleep study being performed.      Patr   PW/MEDQ  D:  05/20/2004  T:  05/21/2004  Job:  [id number]   cc:   Madelin Rear. Sherwood Gambler, M.D.  [address]  [place]  [place] [address]  Fax: [phone]

## 2011-01-24 NOTE — Consult Note (Signed)
Rodney Moreno, SUDER NO.:  [id number]  MEDICAL RECORD NO.:  [id number]           PATIENT TYPE:  LOCATION:                                 FACILITY:  PHYSICIAN:  Lionel December, M.D.    DATE OF BIRTH:  Jan 16, 1951  DATE OF CONSULTATION:  01/04/2011 DATE OF DISCHARGE:                                CONSULTATION   REFERRING PHYSICIAN:  Madelin Rear. Sherwood Gambler, MD  REASON FOR CONSULTATION:  Heme-positive stool.  HISTORY OF PRESENT ILLNESS:  Rodney Moreno is a 60 year old white male referred to our office by Dr. Sherwood Gambler.  He was seen by Dr. Sherwood Gambler on November 23, 2010 for a wellness male exam.  He was found to have a slightly positive stool for blood.  He states his appetite is good.  He has had no dysphagia.  No abdominal pain.  No acid reflux.  He denies sore throat.  He usually has a bowel movement 1-3 a day.  There has been no weight loss.  He denies a prior history of bright red rectal bleeding or black stools.  He has never undergone a colonoscopy in the past.  ALLERGIES:  There are no known allergies.  HOME MEDICATIONS: 1. Fish oil 1000 mg 1 a day. 2. Aspirin 81 mg a day. 3. Vasotec 10 mg a day. 4. Multivitamin 1 a day.  PAST SURGICAL HISTORY:  He has a butterfly to his fourth cervical spine which was placed 5 years ago.  He had a "blood blister" removed from his rectum.  PAST MEDICAL HISTORY:  He has a history of hypertension and he has multiple skin cancers to both arms that were recently removed about a week ago by Dr. Margo Aye in [place].  FAMILY HISTORY:  His mother deceased from CHF.  His father deceased from lung cancer.  One sister is in fair health.  Two brothers, one has a history of prostate cancer and one is in good health.  He is married. He works at [employer]  He does not smoke, drink, or do drugs and he has two children in good health.  OBJECTIVE:  VITAL SIGNS:  His weight is 227.7, his height is 5 feet 10 inches, his temperature is 98.6, his blood  pressure is 100/68, and his pulse is 76. HEENT:  He has natural teeth.  His oral mucosa is moist.  There are no lesions.  His conjunctivae are pink.  His sclerae are anicteric. NECK:  His thyroid is normal.  There is no cervical lymphadenopathy. LUNGS:  Clear. HEART:  Regular rate and rhythm. ABDOMEN:  Soft.  Bowel sounds are positive.  No masses.  No tenderness. EXTREMITIES:  There is no edema to his extremities.  ASSESSMENT:  Rodney Moreno is a 60 year old male who has heme positive stool. He has no GI symptoms and does not take NSAIDS except low dose ASA. We need to rule out occult neoplasm.  RECOMMENDATIONS:  We will schedule a colonoscopy in the near future.  The risks and benefits were reviewed with the patient.  He is agreeable.    ______________________________ Dorene Ar, NP  ______________________________ Lionel December, M.D.    TS/MEDQ  D:  01/04/2011  T:  01/05/2011  Job:  [id number]  cc:   Madelin Rear. Sherwood Gambler, MD Fax: [phone]  Electronically Signed by Dorene Ar PA on 01/06/2011 05:26:43 PM Electronically Signed by Lionel December M.D. on 01/24/2011 06:14:58 PM

## 2011-02-03 ENCOUNTER — Other Ambulatory Visit (INDEPENDENT_AMBULATORY_CARE_PROVIDER_SITE_OTHER): Payer: Self-pay | Admitting: Internal Medicine

## 2011-02-03 ENCOUNTER — Encounter (HOSPITAL_BASED_OUTPATIENT_CLINIC_OR_DEPARTMENT_OTHER): Payer: Managed Care, Other (non HMO) | Admitting: Internal Medicine

## 2011-02-03 ENCOUNTER — Ambulatory Visit (HOSPITAL_COMMUNITY)
Admission: RE | Admit: 2011-02-03 | Discharge: 2011-02-03 | Disposition: A | Discharge status: Home / Self Care | Payer: Managed Care, Other (non HMO) | Source: Ambulatory Visit | Attending: Internal Medicine | Admitting: Internal Medicine

## 2011-02-03 DIAGNOSIS — D126 Benign neoplasm of colon, unspecified: Secondary | ICD-10-CM

## 2011-02-03 DIAGNOSIS — Z79899 Other long term (current) drug therapy: Secondary | ICD-10-CM | POA: Insufficient documentation

## 2011-02-03 DIAGNOSIS — K5730 Diverticulosis of large intestine without perforation or abscess without bleeding: Secondary | ICD-10-CM

## 2011-02-03 DIAGNOSIS — Z7982 Long term (current) use of aspirin: Secondary | ICD-10-CM | POA: Insufficient documentation

## 2011-02-03 DIAGNOSIS — K921 Melena: Secondary | ICD-10-CM | POA: Insufficient documentation

## 2011-02-28 NOTE — Op Note (Signed)
  NAME:  Rodney Moreno, MARONE NO.:  [id number]  MEDICAL RECORD NO.:  [id number]  LOCATION:  DAYP                          FACILITY:  [facility]  PHYSICIAN:  Lionel December, M.D.    DATE OF BIRTH:  Jan 19, 1951  DATE OF PROCEDURE: DATE OF DISCHARGE:                              OPERATIVE REPORT   PROCEDURE:  Colonoscopy.  INDICATION:  Jamiah is 60 year old Caucasian male who recently found to have heme-positive stools.  He denies history of abdominal pain, melena, or rectal bleeding.  Family history is negative for colorectal carcinoma.  Procedures were reviewed with the patient.  Informed consent was obtained.  MEDS FOR CONSCIOUS SEDATION: 1. Demerol 50 mg IV. 2. Versed 5 mg IV.  FINDINGS:  Procedure performed in endoscopy suite.  The patient's vital signs and O2 sat were monitored during the procedure and remained stable.  The patient was placed in left recumbent position, rectal examination performed.  No abnormality noted on external or digital exam.  Pentax videoscope was placed in rectum and advanced under vision into sigmoid colon and beyond.  Preparation was excellent.  Few diverticula noted at hepatic flexure.  Scope was passed into cecum which was identified by appendiceal orifice and ileocecal valve.  Short segment of GI was also examined was normal.  As the scope was withdrawn, colonic mucosa was carefully examined.  There was 4-5 mm polyp at sigmoid colon.  This was ablated via cold biopsy.  Rest of the colonic mucosa was normal.  Rectal mucosa similarly was normal.  Scope was retroflexed to examine anorectal junction which was unremarkable. Endoscope was then withdrawn.  Withdrawal time was 15 minutes.  The patient tolerated the procedure well.  FINAL DIAGNOSES: 1. Normal terminal ileum. 2. Few small diverticula at hepatic flexure. 3. A 4-5 mm polyp ablated via cold biopsy from sigmoid colon.  RECOMMENDATIONS: 1. Standard instructions given. 2.  High-fiber diet. 3. I will be contacting patient with results of biopsy and further     recommendations.          ______________________________ Lionel December, M.D.     NR/MEDQ  D:  02/03/2011  T:  02/04/2011  Job:  [id number]  cc:   Madelin Rear. Sherwood Gambler, MD Fax: [phone]  Electronically Signed by Lionel December M.D. on 02/28/2011 12:18:48 AM

## 2011-05-30 LAB — DIFFERENTIAL
Basophils Absolute: 0
Basophils Relative: 0
Eosinophils Absolute: 0.1
Eosinophils Absolute: 0.1
Lymphocytes Relative: 24
Lymphs Abs: 1.8
Monocytes Absolute: 0.6
Monocytes Absolute: 0.6
Neutrophils Relative %: 66
Neutrophils Relative %: 70

## 2011-05-30 LAB — LIPASE, BLOOD: Lipase: 28

## 2011-05-30 LAB — PROTIME-INR
INR: 1
INR: 1.1
Prothrombin Time: 14.2

## 2011-05-30 LAB — SALICYLATE LEVEL: Salicylate Lvl: <4

## 2011-05-30 LAB — URINALYSIS, ROUTINE W REFLEX MICROSCOPIC
Glucose, UA: NEGATIVE
Ketones, ur: NEGATIVE
Protein, ur: NEGATIVE
Specific Gravity, Urine: >1.03 — ABNORMAL HIGH
Urobilinogen, UA: 0.2
pH: 6

## 2011-05-30 LAB — COMPREHENSIVE METABOLIC PANEL
ALT: 31
Albumin: 3.7
Albumin: 3.9
BUN: 17
CO2: 25
Calcium: 9.1
Chloride: 104
Chloride: 106
GFR calc Af Amer: >60
GFR calc non Af Amer: >60
GFR calc non Af Amer: >60
Potassium: 3.5
Potassium: 4.1
Sodium: 138
Sodium: 142
Total Bilirubin: 0.6
Total Bilirubin: 0.7
Total Protein: 6.2

## 2011-05-30 LAB — BLOOD GAS, ARTERIAL
Acid-Base Excess: 0.4
O2 Content: 2

## 2011-05-30 LAB — DRUG SCREEN PANEL (SERUM)
Barbiturate Scrn: NEGATIVE
Cannabinoid, Blood: NEGATIVE
Cocaine (Metabolite): NEGATIVE
Methadone (Dolophine), Serum: NEGATIVE
Opiates, Blood: NEGATIVE
Phencyclidine, Serum: NEGATIVE

## 2011-05-30 LAB — AMMONIA: Ammonia: 20

## 2011-05-30 LAB — APTT
aPTT: 29
aPTT: 32

## 2011-05-30 LAB — CBC
HCT: 42.3
HCT: 44.6
Hemoglobin: 15.3
MCHC: 34.3
MCHC: 34.4
Platelets: 232
RBC: 4.58
RBC: 4.82
RDW: 12.7
WBC: 7.3

## 2011-05-30 LAB — LACTIC ACID, PLASMA: Lactic Acid, Venous: 1

## 2011-05-30 LAB — ACETAMINOPHEN LEVEL: Acetaminophen (Tylenol), Serum: <10 — ABNORMAL LOW

## 2011-05-30 LAB — RAPID URINE DRUG SCREEN, HOSP PERFORMED: Cocaine: NOT DETECTED

## 2011-05-30 LAB — ETHANOL: Alcohol, Ethyl (B): <5

## 2020-03-14 ENCOUNTER — Ambulatory Visit
Admission: EM | Admit: 2020-03-14 | Discharge: 2020-03-14 | Disposition: A | Discharge status: Home / Self Care | Payer: 59 | Attending: Emergency Medicine | Admitting: Emergency Medicine

## 2020-03-14 ENCOUNTER — Other Ambulatory Visit: Payer: Self-pay

## 2020-03-14 ENCOUNTER — Encounter: Payer: Self-pay | Admitting: Emergency Medicine

## 2020-03-14 DIAGNOSIS — R509 Fever, unspecified: Secondary | ICD-10-CM

## 2020-03-14 DIAGNOSIS — Z1152 Encounter for screening for COVID-19: Secondary | ICD-10-CM

## 2020-03-14 MED ORDER — PREDNISONE 10 MG PO TABS
20.0000 mg | ORAL_TABLET | Freq: Every day | ORAL | 0 refills | Status: DC
Start: 2020-03-14 — End: 2020-08-25

## 2020-03-14 NOTE — Discharge Instructions (Addendum)

## 2020-03-14 NOTE — ED Triage Notes (Signed)
Headache, fever, fatigue and sore throat that started yesterday. Pt had covid vaccines.

## 2020-03-14 NOTE — ED Provider Notes (Signed)
[place]   [id number] 03/14/20 Arrival Time: 4098  Chief Complaint  Patient presents with  . Fever     SUBJECTIVE: History from: patient.  Rodney Moreno is a 69 y.o. male who presented to the urgent care for complaint of fever, fatigue, sore throat headache that started yesterday.  Reported he had Covid vaccine.  Denies precipitating event or positive sick exposure to strep, flu or Covid.  Has tried OTC tylenol/ motrin with relief.  Denies aggravating or alleviating factors.  Denies similar symptoms in the past.   Denies night sweats, decreased appetite, decreased activity, otalgia, drooling, vomiting, cough, wheezing, rash, strong urine odor, dark colored urine, changes in bowel or bladder function.     ROS: As per HPI.  All other pertinent ROS negative.     History reviewed. No pertinent past medical history. Past Surgical History:  Procedure Laterality Date  . MOUTH SURGERY     No Known Allergies No current facility-administered medications on file prior to encounter.   No current outpatient medications on file prior to encounter.   Social History   Socioeconomic History  . Marital status: Divorced    Spouse name: Not on file  . Number of children: Not on file  . Years of education: Not on file  . Highest education level: Not on file  Occupational History  . Not on file  Tobacco Use  . Smoking status: Never Smoker  . Smokeless tobacco: Never Used  Substance and Sexual Activity  . Alcohol use: Never  . Drug use: Never  . Sexual activity: Not on file  Other Topics Concern  . Not on file  Social History Narrative  . Not on file   Social Determinants of Health   Financial Resource Strain:   . Difficulty of Paying Living Expenses:   Food Insecurity:   . Worried About Charity fundraiser in the Last Year:   . Arboriculturist in the Last Year:   Transportation Needs:   . Film/video editor (Medical):   Marland Kitchen Lack of Transportation (Non-Medical):     Physical Activity:   . Days of Exercise per Week:   . Minutes of Exercise per Session:   Stress:   . Feeling of Stress :   Social Connections:   . Frequency of Communication with Friends and Family:   . Frequency of Social Gatherings with Friends and Family:   . Attends Religious Services:   . Active Member of Clubs or Organizations:   . Attends Archivist Meetings:   Marland Kitchen Marital Status:   Intimate Partner Violence:   . Fear of Current or Ex-Partner:   . Emotionally Abused:   Marland Kitchen Physically Abused:   . Sexually Abused:    No family history on file.  OBJECTIVE:  Vitals:   03/14/20 1402 03/14/20 1405  BP:  101/63  Pulse:  87  Resp:  18  Temp:  99.2 F (37.3 C)  TempSrc:  Oral  SpO2:  95%  Weight: 269 lb (122 kg)   Height: 5\' 11"  (1.803 m)      General appearance: alert; smiling and laughing during encounter; nontoxic appearance HEENT: NCAT; Ears: EACs clear, TMs pearly gray; Eyes: EOM grossly intact. Nose: no rhinorrhea without nasal flaring; Throat: oropharynx clear, tonsils not enlarged or erythematous, uvula midline Neck: supple without LAD Lungs: CTA bilaterally without adventitious breath sounds; normal respiratory effort, no belly breathing or accessory muscle use; no cough present Heart: regular rate and rhythm.  Radial pulses 2+ symmetrical bilaterally Abdomen: soft; normal active bowel sounds; nontender to palpation Skin: warm and dry; no obvious rashes Psychological: alert and cooperative; normal mood and affect appropriate for age   ASSESSMENT & PLAN:  1. Fever, unspecified   2. Encounter for screening for COVID-19     Meds ordered this encounter  Medications  . predniSONE (DELTASONE) 10 MG tablet    Sig: Take 2 tablets (20 mg total) by mouth daily.    Dispense:  15 tablet    Refill:  0     Discharge Instructions COVID testing ordered.  It will take between 2-7 days for test results.  Someone will contact you regarding abnormal results.     In the meantime: You should remain isolated in your home for 10 days from symptom onset AND greater than 24 hours after symptoms resolution (absence of fever without the use of fever-reducing medication and improvement in respiratory symptoms), whichever is longer Get plenty of rest and push fluids Use medications daily for symptom relief Use OTC medications like ibuprofen or tylenol as needed fever or pain Call or go to the ED if you have any new or worsening symptoms such as fever, worsening cough, shortness of breath, chest tightness, chest pain, turning blue, changes in mental status, etc...   Reviewed expectations re: course of current medical issues. Questions answered. Outlined signs and symptoms indicating need for more acute intervention. Patient verbalized understanding. After Visit Summary given.      Note: This document was prepared using Dragon voice recognition software and may include unintentional dictation errors.      Emerson Monte, [place] 03/14/20 1456

## 2020-03-15 LAB — NOVEL CORONAVIRUS, NAA: SARS-CoV-2, NAA: NOT DETECTED

## 2020-03-15 LAB — SARS-COV-2, NAA 2 DAY TAT

## 2020-08-25 ENCOUNTER — Other Ambulatory Visit: Payer: Self-pay

## 2020-08-25 ENCOUNTER — Ambulatory Visit
Admission: EM | Admit: 2020-08-25 | Discharge: 2020-08-25 | Disposition: A | Discharge status: Home / Self Care | Payer: 59 | Attending: Physician Assistant | Admitting: Physician Assistant

## 2020-08-25 ENCOUNTER — Ambulatory Visit (INDEPENDENT_AMBULATORY_CARE_PROVIDER_SITE_OTHER): Payer: 59

## 2020-08-25 DIAGNOSIS — M25572 Pain in left ankle and joints of left foot: Secondary | ICD-10-CM

## 2020-08-25 DIAGNOSIS — M79672 Pain in left foot: Secondary | ICD-10-CM | POA: Diagnosis not present

## 2020-08-25 HISTORY — DX: Essential (primary) hypertension: I10

## 2020-08-25 MED ORDER — PREDNISONE 50 MG PO TABS
ORAL_TABLET | ORAL | 0 refills | Status: DC
Start: 2020-08-25 — End: 2021-04-19

## 2020-08-25 NOTE — Discharge Instructions (Addendum)
You may have gout.  See your Physician for recheck in 1 week.  Go to the Emergency department if symptoms worsen or change

## 2020-08-25 NOTE — ED Triage Notes (Signed)
Pt presents with complaints of foot, ankle and greater toe swelling and pain to his left foot x 5 days. Denies any injury. Reports swelling gradually got worse this morning.

## 2020-08-27 NOTE — ED Provider Notes (Signed)
[facility]    CSN: [id number] Arrival date & time: 08/25/20  1100      History   Chief Complaint Chief Complaint  Patient presents with  . Ankle Pain    HPI Rodney Moreno is a 69 y.o. male.   The history is provided by the patient. No language interpreter was used.  Ankle Pain Location:  Ankle Time since incident:  5 days Injury: no   Pain details:    Quality:  Aching   Radiates to:  Does not radiate   Severity:  Moderate   Timing:  Constant   Progression:  Worsening Chronicity:  New Dislocation: no   Foreign body present:  Unable to specify Relieved by:  Nothing Worsened by:  Nothing Ineffective treatments:  None tried Risk factors: no known bone disorder     Past Medical History:  Diagnosis Date  . Hypertension     There are no problems to display for this patient.   Past Surgical History:  Procedure Laterality Date  . MOUTH SURGERY         Home Medications    Prior to Admission medications   Medication Sig Start Date End Date Taking? Authorizing Provider  predniSONE (DELTASONE) 50 MG tablet One tablet a day for 5 days 08/25/20  Yes Fransico Meadow, PA-C    Family History Family History  Family history unknown: Yes    Social History Social History   Tobacco Use  . Smoking status: Never Smoker  . Smokeless tobacco: Never Used  Substance Use Topics  . Alcohol use: Never  . Drug use: Never     Allergies   Patient has no known allergies.   Review of Systems Review of Systems  Musculoskeletal: Positive for joint swelling and myalgias.  All other systems reviewed and are negative.    Physical Exam Triage Vital Signs ED Triage Vitals  Enc Vitals Group     BP 08/25/20 1216 125/82     Pulse Rate 08/25/20 1216 71     Resp 08/25/20 1216 19     Temp 08/25/20 1216 98 F (36.7 C)     Temp src --      SpO2 08/25/20 1216 95 %     Weight --      Height --      Head Circumference --      Peak Flow --      Pain Score  08/25/20 1215 9     Pain Loc --      Pain Edu? --      Excl. in [place]? --    No data found.  Updated Vital Signs BP 125/82   Pulse 71   Temp 98 F (36.7 C)   Resp 19   SpO2 95%   Visual Acuity Right Eye Distance:   Left Eye Distance:   Bilateral Distance:    Right Eye Near:   Left Eye Near:    Bilateral Near:     Physical Exam Vitals and nursing note reviewed.  Constitutional:      Appearance: He is well-developed and well-nourished.  HENT:     Head: Normocephalic and atraumatic.  Cardiovascular:     Rate and Rhythm: Normal rate.  Pulmonary:     Effort: Pulmonary effort is normal.  Musculoskeletal:        General: Swelling and tenderness present. No edema.     Cervical back: Neck supple.  Skin:    General: Skin is warm and dry.  Neurological:     Mental Status: He is alert.  Psychiatric:        Mood and Affect: Mood and affect normal.      UC Treatments / Results  Labs (all labs ordered are listed, but only abnormal results are displayed) Labs Reviewed - No data to display  EKG   Radiology No results found.  Procedures Procedures (including critical care time)  Medications Ordered in UC Medications - No data to display  Initial Impression / Assessment and Plan / UC Course  I have reviewed the triage vital signs and the nursing notes.  Pertinent labs & imaging results that were available during my care of the patient were reviewed by me and considered in my medical decision making (see chart for details).     MDM:  Xray no acute abnormality, I suspect pt has gout.  Pt given rx for Prednisone  Final Clinical Impressions(s) / UC Diagnoses   Final diagnoses:  Acute left ankle pain  Foot pain, left     Discharge Instructions     You may have gout.  See your Physician for recheck in 1 week.  Go to the Emergency department if symptoms worsen or change   ED Prescriptions    Medication Sig Dispense Auth. Provider   predniSONE (DELTASONE) 50  MG tablet One tablet a day for 5 days 5 tablet Elson Areas, [place]     PDMP not reviewed this encounter.  An After Visit Summary was printed and given to the patient.    Elson Areas, [place] 08/27/20 1437

## 2021-01-14 ENCOUNTER — Encounter (INDEPENDENT_AMBULATORY_CARE_PROVIDER_SITE_OTHER): Payer: Self-pay | Admitting: *Deleted

## 2021-04-19 ENCOUNTER — Other Ambulatory Visit: Payer: Self-pay

## 2021-04-19 ENCOUNTER — Encounter: Payer: Self-pay | Admitting: Emergency Medicine

## 2021-04-19 ENCOUNTER — Ambulatory Visit
Admission: EM | Admit: 2021-04-19 | Discharge: 2021-04-19 | Disposition: A | Discharge status: Home / Self Care | Payer: 59

## 2021-04-19 DIAGNOSIS — M79675 Pain in left toe(s): Secondary | ICD-10-CM

## 2021-04-19 DIAGNOSIS — M109 Gout, unspecified: Secondary | ICD-10-CM

## 2021-04-19 MED ORDER — PREDNISONE 20 MG PO TABS
40.0000 mg | ORAL_TABLET | Freq: Every day | ORAL | 0 refills | Status: AC
Start: 2021-04-20 — End: 2021-04-25

## 2021-04-19 MED ORDER — DEXAMETHASONE SODIUM PHOSPHATE 10 MG/ML IJ SOLN
10.0000 mg | Freq: Once | INTRAMUSCULAR | Status: AC
  Administered 2021-04-19: 10 mg via INTRAMUSCULAR

## 2021-04-19 NOTE — ED Provider Notes (Signed)
[facility]    CSN: [id number] Arrival date & time: 04/19/21  1026      History   Chief Complaint No chief complaint on file.   HPI Rodney Moreno is a 70 y.o. male.   HPI Patient presents today with left great toe pain which he suspects is gout. The pain is localized to the bunion joint of the left great toe.. Denies any known injury. Endorses redness and warmth at the joint. No prior episodes of gout. Endorses pain is worst at rest or when lying down pain is experienced when sheet hits the great toe.  Past Medical History:  Diagnosis Date   Hypertension     There are no problems to display for this patient.   Past Surgical History:  Procedure Laterality Date   MOUTH SURGERY         Home Medications    Prior to Admission medications   Medication Sig Start Date End Date Taking? Authorizing Provider  enalapril (VASOTEC) 5 MG tablet Take 5 mg by mouth daily.   Yes [provider]  predniSONE (DELTASONE) 20 MG tablet Take 2 tablets (40 mg total) by mouth daily with breakfast for 5 days. 04/20/21 04/25/21 Yes Scot Jun, FNP    Family History Family History  Problem Relation Age of Onset   Cancer Father     Social History Social History   Tobacco Use   Smoking status: Never   Smokeless tobacco: Never  Substance Use Topics   Alcohol use: Never   Drug use: Never     Allergies   Patient has no known allergies.   Review of Systems Review of Systems Pertinent negatives listed in HPI   Physical Exam Triage Vital Signs ED Triage Vitals  Enc Vitals Group     BP 04/19/21 1226 129/89     Pulse Rate 04/19/21 1226 74     Resp 04/19/21 1226 20     Temp 04/19/21 1226 97.8 F (36.6 C)     Temp src --      SpO2 04/19/21 1226 96 %     Weight --      Height --      Head Circumference --      Peak Flow --      Pain Score 04/19/21 1105 9     Pain Loc --      Pain Edu? --      Excl. in [place]? --    No data found.  Updated  Vital Signs BP 129/89   Pulse 74   Temp 97.8 F (36.6 C)   Resp 20   SpO2 96%   Visual Acuity Right Eye Distance:   Left Eye Distance:   Bilateral Distance:    Right Eye Near:   Left Eye Near:    Bilateral Near:     Physical Exam General appearance: Alert, well developed, well nourished, cooperative  Head: Normocephalic, without obvious abnormality, atraumatic Respiratory: Respirations even and unlabored, normal respiratory rate Heart: Rate and rhythm normal. No gallop or murmurs noted on exam  Extremities: Right great toe erythematous with swelling  Skin: Skin color, texture, turgor normal. No rashes seen  Psych: Appropriate mood and affect. Neurologic: GCS 15, normal coordination , or normal gait  UC Treatments / Results  Labs (all labs ordered are listed, but only abnormal results are displayed) Labs Reviewed - No data to display  EKG   Radiology No results found.  Procedures Procedures (including critical care time)  Medications Ordered in UC Medications  dexamethasone (DECADRON) injection 10 mg (10 mg Intramuscular Given 04/19/21 1300)    Initial Impression / Assessment and Plan / UC Course  I have reviewed the triage vital signs and the nursing notes.  Pertinent labs & imaging results that were available during my care of the patient were reviewed by me and considered in my medical decision making (see chart for details).      Great toe secondary to Gout Decadron IM given here in clinic  Start oral prednisone tomorrow. Follow-up with PCP regarding preventative therapy if warranted Final diagnoses:  Great toe pain, left  Acute gout involving toe of left foot, unspecified cause     Discharge Instructions      Start prednisone tomorrow. You received steroid shot today.     ED Prescriptions     Medication Sig Dispense Auth. Provider   predniSONE (DELTASONE) 20 MG tablet Take 2 tablets (40 mg total) by mouth daily with breakfast for 5 days.  10 tablet Scot Jun, FNP      PDMP not reviewed this encounter.   Scot Jun, FNP 04/19/21 [phone]

## 2021-04-19 NOTE — Discharge Instructions (Addendum)
Start prednisone tomorrow. You received steroid shot today.

## 2021-04-19 NOTE — ED Triage Notes (Signed)
Left great toe red and swollen x 1 week.

## 2021-06-01 ENCOUNTER — Other Ambulatory Visit: Payer: Self-pay | Admitting: Family Medicine

## 2021-06-01 ENCOUNTER — Other Ambulatory Visit (HOSPITAL_COMMUNITY): Payer: Self-pay | Admitting: Family Medicine

## 2021-06-01 DIAGNOSIS — E041 Nontoxic single thyroid nodule: Secondary | ICD-10-CM

## 2021-06-07 ENCOUNTER — Ambulatory Visit (HOSPITAL_COMMUNITY)
Admission: RE | Admit: 2021-06-07 | Discharge: 2021-06-07 | Disposition: A | Discharge status: Home / Self Care | Payer: 59 | Source: Ambulatory Visit | Attending: Family Medicine | Admitting: Family Medicine

## 2021-06-07 ENCOUNTER — Other Ambulatory Visit: Payer: Self-pay

## 2021-06-07 DIAGNOSIS — E041 Nontoxic single thyroid nodule: Secondary | ICD-10-CM | POA: Diagnosis present

## 2021-07-07 ENCOUNTER — Other Ambulatory Visit: Payer: Self-pay | Admitting: Internal Medicine

## 2021-07-07 DIAGNOSIS — E042 Nontoxic multinodular goiter: Secondary | ICD-10-CM

## 2021-07-13 NOTE — Progress Notes (Deleted)
      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  Past Medical History:  Diagnosis Date   Hypertension    Surgical History: Past Surgical History:  Procedure Laterality Date   MOUTH SURGERY     Family History: Family History  Problem Relation Age of Onset   Cancer Father     Social History Social History   Tobacco Use   Smoking status: Never   Smokeless tobacco: Never  Substance Use Topics   Alcohol use: Never   Drug use: Never    Current Outpatient Medications  Medication Sig Dispense Refill   enalapril (VASOTEC) 5 MG tablet Take 5 mg by mouth daily.    Vital Signs:  Allergies: No Known Allergies   Physical Exam: CV Pulmonary Abdomen Extremities   Diagnostic Tests:   Impression and Plan: We discussed the importance of strict BP control. We discussed avoidance Of fluoroquinolone antibiotics as there has been studies that have shown An increase risk of rupture/dissection.     Nani Skillern, PA-C [facility] [phone]

## 2021-08-12 ENCOUNTER — Ambulatory Visit
Admission: RE | Admit: 2021-08-12 | Discharge: 2021-08-12 | Disposition: A | Discharge status: Home / Self Care | Payer: 59 | Source: Ambulatory Visit | Attending: Internal Medicine | Admitting: Internal Medicine

## 2021-08-12 ENCOUNTER — Other Ambulatory Visit (HOSPITAL_COMMUNITY)
Admission: RE | Admit: 2021-08-12 | Discharge: 2021-08-12 | Disposition: A | Discharge status: Home / Self Care | Payer: 59 | Source: Ambulatory Visit | Attending: Radiology | Admitting: Radiology

## 2021-08-12 DIAGNOSIS — E042 Nontoxic multinodular goiter: Secondary | ICD-10-CM

## 2021-08-12 DIAGNOSIS — E041 Nontoxic single thyroid nodule: Secondary | ICD-10-CM | POA: Diagnosis present

## 2021-08-13 LAB — CYTOLOGY - NON PAP

## 2021-11-02 DIAGNOSIS — C44219 Basal cell carcinoma of skin of left ear and external auricular canal: Secondary | ICD-10-CM | POA: Diagnosis not present

## 2021-11-02 DIAGNOSIS — L821 Other seborrheic keratosis: Secondary | ICD-10-CM | POA: Diagnosis not present

## 2021-11-02 DIAGNOSIS — L570 Actinic keratosis: Secondary | ICD-10-CM | POA: Diagnosis not present

## 2021-11-02 DIAGNOSIS — Z85828 Personal history of other malignant neoplasm of skin: Secondary | ICD-10-CM | POA: Diagnosis not present

## 2021-11-02 DIAGNOSIS — L718 Other rosacea: Secondary | ICD-10-CM | POA: Diagnosis not present

## 2021-12-17 DIAGNOSIS — I10 Essential (primary) hypertension: Secondary | ICD-10-CM | POA: Diagnosis not present

## 2021-12-17 DIAGNOSIS — E782 Mixed hyperlipidemia: Secondary | ICD-10-CM | POA: Diagnosis not present

## 2021-12-17 DIAGNOSIS — Z0001 Encounter for general adult medical examination with abnormal findings: Secondary | ICD-10-CM | POA: Diagnosis not present

## 2021-12-17 DIAGNOSIS — Z1331 Encounter for screening for depression: Secondary | ICD-10-CM | POA: Diagnosis not present

## 2021-12-17 DIAGNOSIS — I712 Thoracic aortic aneurysm, without rupture, unspecified: Secondary | ICD-10-CM | POA: Diagnosis not present

## 2021-12-17 DIAGNOSIS — E6609 Other obesity due to excess calories: Secondary | ICD-10-CM | POA: Diagnosis not present

## 2021-12-17 DIAGNOSIS — Z6841 Body Mass Index (BMI) 40.0 and over, adult: Secondary | ICD-10-CM | POA: Diagnosis not present

## 2021-12-21 ENCOUNTER — Other Ambulatory Visit: Payer: Self-pay | Admitting: Internal Medicine

## 2021-12-21 ENCOUNTER — Other Ambulatory Visit (HOSPITAL_COMMUNITY): Payer: Self-pay | Admitting: Internal Medicine

## 2021-12-21 DIAGNOSIS — I712 Thoracic aortic aneurysm, without rupture, unspecified: Secondary | ICD-10-CM

## 2022-01-25 ENCOUNTER — Ambulatory Visit (HOSPITAL_COMMUNITY): Payer: 59

## 2022-01-25 ENCOUNTER — Encounter (HOSPITAL_COMMUNITY): Payer: Self-pay

## 2022-02-01 DIAGNOSIS — R310 Gross hematuria: Secondary | ICD-10-CM | POA: Diagnosis not present

## 2022-02-01 DIAGNOSIS — R319 Hematuria, unspecified: Secondary | ICD-10-CM | POA: Diagnosis not present

## 2022-02-01 DIAGNOSIS — M4317 Spondylolisthesis, lumbosacral region: Secondary | ICD-10-CM | POA: Diagnosis not present

## 2022-02-01 DIAGNOSIS — K760 Fatty (change of) liver, not elsewhere classified: Secondary | ICD-10-CM | POA: Diagnosis not present

## 2022-02-02 DIAGNOSIS — R319 Hematuria, unspecified: Secondary | ICD-10-CM | POA: Diagnosis not present

## 2022-02-02 DIAGNOSIS — N3289 Other specified disorders of bladder: Secondary | ICD-10-CM | POA: Diagnosis not present

## 2022-02-02 DIAGNOSIS — I712 Thoracic aortic aneurysm, without rupture, unspecified: Secondary | ICD-10-CM | POA: Diagnosis not present

## 2022-02-02 DIAGNOSIS — I10 Essential (primary) hypertension: Secondary | ICD-10-CM | POA: Diagnosis not present

## 2022-02-16 ENCOUNTER — Other Ambulatory Visit: Payer: Self-pay | Admitting: Urology

## 2022-02-16 ENCOUNTER — Ambulatory Visit (INDEPENDENT_AMBULATORY_CARE_PROVIDER_SITE_OTHER): Payer: 59 | Admitting: Urology

## 2022-02-16 VITALS — BP 117/77 | HR 98

## 2022-02-16 DIAGNOSIS — D494 Neoplasm of unspecified behavior of bladder: Secondary | ICD-10-CM | POA: Diagnosis not present

## 2022-02-16 DIAGNOSIS — R310 Gross hematuria: Secondary | ICD-10-CM | POA: Diagnosis not present

## 2022-02-16 LAB — URINALYSIS, ROUTINE W REFLEX MICROSCOPIC
Bilirubin, UA: NEGATIVE
Glucose, UA: NEGATIVE
Ketones, UA: NEGATIVE
Leukocytes,UA: NEGATIVE
Nitrite, UA: NEGATIVE
RBC, UA: NEGATIVE
Specific Gravity, UA: 1.02 (ref 1.005–1.030)
Urobilinogen, Ur: 0.2 mg/dL (ref 0.2–1.0)
pH, UA: 5.5 (ref 5.0–7.5)

## 2022-02-16 NOTE — Progress Notes (Unsigned)
post void residual=0 ?

## 2022-02-16 NOTE — Progress Notes (Unsigned)
Surgical Physician Order Form [facility]  * Scheduling expectation :  02/24/2022  *Length of Case: 30 minutes  *MD Preforming Case: Nicolette Bang, MD  *Assistant Needed: no  *Facility Preference: [facility]  *Clearance needed: no  *Anticoagulation Instructions: Hold all anticoagulants  *Aspirin Instructions: Ok to continue Aspirin  -Admit type: OUTpatient  -Anesthesia: General  -Use Standing Orders:  NA  *Diagnosis: Bladder Tumor 3cm  *Procedure:     TURBT 2-5cm (47096) bilateral retrogrades  Additional orders: Gemcitabine '2000mg'$  bladder instillation  -Equipment: C-Arm -VTE Prophylaxis Standing Order SCD's       Other:   -Standing Lab Orders Per Anesthesia    Lab other: None  -Standing Test orders EKG/Chest x-ray per Anesthesia       Test other:   - Medications:  Ancef 2gm IV  -Other orders:  Ok to proceed with Ancef PCN allergy reviewed  *Post-op visit Date/Instructions:  1 week voiding trial

## 2022-02-16 NOTE — Progress Notes (Unsigned)
02/16/2022 2:24 PM   Junie Panning 01/25/1951 [id number]  Referring provider: Redmond School, [place] [place],  [place] [address]  Gross hematuria   HPI: Mr Dhawan is a 71yo here for evaluation of gross hematuria. 2 months ago he developed multiple episodes of gross painless hematuria. He underwent CT in [place] which showed a 2cm bladder tumor.    PMH: Past Medical History:  Diagnosis Date   Hypertension     Surgical History: Past Surgical History:  Procedure Laterality Date   MOUTH SURGERY      Home Medications:  Allergies as of 02/16/2022   No Known Allergies      Medication List        Accurate as of February 16, 2022  2:24 PM. If you have any questions, ask your nurse or doctor.          enalapril 5 MG tablet Commonly known as: VASOTEC Take 5 mg by mouth daily.   ezetimibe 10 MG tablet Commonly known as: ZETIA Take 10 mg by mouth daily.   sildenafil 20 MG tablet Commonly known as: REVATIO Take 100 mg by mouth daily.        Allergies: No Known Allergies  Family History: Family History  Problem Relation Age of Onset   Cancer Father     Social History:  reports that he has never smoked. He has never used smokeless tobacco. He reports that he does not drink alcohol and does not use drugs.  ROS: All other review of systems were reviewed and are negative except what is noted above in HPI  Physical Exam: BP 117/77   Pulse 98   Constitutional:  Alert and oriented, No acute distress. HEENT: [place] AT, moist mucus membranes.  Trachea midline, no masses. Cardiovascular: No clubbing, cyanosis, or edema. Respiratory: Normal respiratory effort, no increased work of breathing. GI: Abdomen is soft, nontender, nondistended, no abdominal masses GU: No CVA tenderness.  Lymph: No cervical or inguinal lymphadenopathy. Skin: No rashes, bruises or suspicious lesions. Neurologic: Grossly intact, no focal deficits, moving all 4  extremities. Psychiatric: Normal mood and affect.  Laboratory Data: Lab Results  Component Value Date   WBC 7.3 06/23/2008   HGB 14.5 06/23/2008   HCT 42.3 06/23/2008   MCV 92.4 06/23/2008   PLT 232 06/23/2008    Lab Results  Component Value Date   CREATININE 1.03 06/23/2008    No results found for: "PSA"  No results found for: "TESTOSTERONE"  No results found for: "HGBA1C"  Urinalysis    Component Value Date/Time   COLORURINE YELLOW 06/22/2008 1924   APPEARANCEUR CLEAR 06/22/2008 1924   LABSPEC >1.030 (H) 06/22/2008 1924   PHURINE 6.0 06/22/2008 1924   GLUCOSEU NEGATIVE 06/22/2008 [place] NEGATIVE 06/22/2008 [place] NEGATIVE 06/22/2008 [place] 06/22/2008 1924   PROTEINUR NEGATIVE 06/22/2008 1924   UROBILINOGEN 0.2 06/22/2008 1924   NITRITE NEGATIVE 06/22/2008 1924   LEUKOCYTESUR  06/22/2008 1924    NEGATIVE MICROSCOPIC NOT DONE ON URINES WITH NEGATIVE PROTEIN, BLOOD, LEUKOCYTES, NITRITE, OR GLUCOSE <1000 mg/dL.    No results found for: "LABMICR", "WBCUA", "RBCUA", "LABEPIT", "MUCUS", "BACTERIA"  Pertinent Imaging: *** No results found for this or any previous visit.  No results found for this or any previous visit.  No results found for this or any previous visit.  No results found for this or any previous visit.  No results found for this or any previous visit.  No results  found for this or any previous visit.  No results found for this or any previous visit.  No results found for this or any previous visit.   Assessment & Plan:    1. Gross hematuria *** - BLADDER SCAN AMB NON-IMAGING - Urinalysis, Routine w reflex microscopic   No follow-ups on file.  Nicolette Bang, MD  [facility] [place]

## 2022-02-16 NOTE — H&P (View-Only) (Signed)
02/16/2022 2:24 PM   Rodney Moreno 09/07/1950 [id number]  Referring provider: Redmond School, [place] [place],  [place] [address]  Gross hematuria   HPI: Rodney Moreno is a 71yo here for evaluation of gross hematuria. 2 months ago he developed multiple episodes of gross painless hematuria. He underwent CT in [place] which showed a 2cm bladder tumor.    PMH: Past Medical History:  Diagnosis Date   Hypertension     Surgical History: Past Surgical History:  Procedure Laterality Date   MOUTH SURGERY      Home Medications:  Allergies as of 02/16/2022   No Known Allergies      Medication List        Accurate as of February 16, 2022  2:24 PM. If you have any questions, ask your nurse or doctor.          enalapril 5 MG tablet Commonly known as: VASOTEC Take 5 mg by mouth daily.   ezetimibe 10 MG tablet Commonly known as: ZETIA Take 10 mg by mouth daily.   sildenafil 20 MG tablet Commonly known as: REVATIO Take 100 mg by mouth daily.        Allergies: No Known Allergies  Family History: Family History  Problem Relation Age of Onset   Cancer Father     Social History:  reports that he has never smoked. He has never used smokeless tobacco. He reports that he does not drink alcohol and does not use drugs.  ROS: All other review of systems were reviewed and are negative except what is noted above in HPI  Physical Exam: BP 117/77   Pulse 98   Constitutional:  Alert and oriented, No acute distress. HEENT: [place] AT, moist mucus membranes.  Trachea midline, no masses. Cardiovascular: No clubbing, cyanosis, or edema. Respiratory: Normal respiratory effort, no increased work of breathing. GI: Abdomen is soft, nontender, nondistended, no abdominal masses GU: No CVA tenderness.  Lymph: No cervical or inguinal lymphadenopathy. Skin: No rashes, bruises or suspicious lesions. Neurologic: Grossly intact, no focal deficits, moving all 4  extremities. Psychiatric: Normal mood and affect.  Laboratory Data: Lab Results  Component Value Date   WBC 7.3 06/23/2008   HGB 14.5 06/23/2008   HCT 42.3 06/23/2008   MCV 92.4 06/23/2008   PLT 232 06/23/2008    Lab Results  Component Value Date   CREATININE 1.03 06/23/2008    No results found for: "PSA"  No results found for: "TESTOSTERONE"  No results found for: "HGBA1C"  Urinalysis    Component Value Date/Time   COLORURINE YELLOW 06/22/2008 1924   APPEARANCEUR CLEAR 06/22/2008 1924   LABSPEC >1.030 (H) 06/22/2008 1924   PHURINE 6.0 06/22/2008 1924   GLUCOSEU NEGATIVE 06/22/2008 [place] NEGATIVE 06/22/2008 [place] NEGATIVE 06/22/2008 [place] 06/22/2008 1924   PROTEINUR NEGATIVE 06/22/2008 1924   UROBILINOGEN 0.2 06/22/2008 1924   NITRITE NEGATIVE 06/22/2008 1924   LEUKOCYTESUR  06/22/2008 1924    NEGATIVE MICROSCOPIC NOT DONE ON URINES WITH NEGATIVE PROTEIN, BLOOD, LEUKOCYTES, NITRITE, OR GLUCOSE <1000 mg/dL.    No results found for: "LABMICR", "WBCUA", "RBCUA", "LABEPIT", "MUCUS", "BACTERIA"  Pertinent Imaging:  No results found for this or any previous visit.  No results found for this or any previous visit.  No results found for this or any previous visit.  No results found for this or any previous visit.  No results found for this or any previous visit.  No results  found for this or any previous visit.  No results found for this or any previous visit.  No results found for this or any previous visit.   Assessment & Plan:    1. Gross hematuria -likely related to large bladder tumor - BLADDER SCAN AMB NON-IMAGING - Urinalysis, Routine w reflex microscopic   No follow-ups on file.  Nicolette Bang, MD  [facility] [place]    Cystoscopy Procedure Note  Patient identification was confirmed, informed consent was obtained, and patient was prepped using Betadine solution.  Lidocaine jelly was  administered per urethral meatus.     Pre-Procedure: - Inspection reveals a normal caliber ureteral meatus.  Procedure: The flexible cystoscope was introduced without difficulty - No urethral strictures/lesions are present. 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 Risks/benefits/alternatives discussed   No follow-ups on file.  Nicolette Bang, MD

## 2022-02-21 ENCOUNTER — Other Ambulatory Visit: Payer: Self-pay

## 2022-02-22 ENCOUNTER — Encounter: Payer: Self-pay | Admitting: Urology

## 2022-02-24 NOTE — Progress Notes (Signed)
Surgery scheduled with patient.

## 2022-02-25 NOTE — Patient Instructions (Signed)
Your procedure is scheduled on: 03/03/2022  Report to [facility] at   7:30  AM.  Call this number if you have problems the morning of surgery: [phone]   Remember:   Do not Eat or Drink after midnight         No Smoking the morning of surgery  :  Take these medicines the morning of surgery with A SIP OF WATER: Ezetimibe and Allopurinol   Do not wear jewelry, make-up or nail polish.  Do not wear lotions, powders, or perfumes. You may wear deodorant.  Do not shave 48 hours prior to surgery. Men may shave face and neck.  Do not bring valuables to the hospital.  Contacts, dentures or bridgework may not be worn into surgery.  Leave suitcase in the car. After surgery it may be brought to your room.  For patients admitted to the hospital, checkout time is 11:00 AM the day of discharge.   Patients discharged the day of surgery will not be allowed to drive home.    Special Instructions: Shower using CHG night before surgery and shower the day of surgery use CHG.  Use special wash - you have one bottle of CHG for all showers.  You should use approximately 1/2 of the bottle for each shower.  How to Use Chlorhexidine for Bathing Chlorhexidine gluconate (CHG) is a germ-killing (antiseptic) solution that is used to clean the skin. It can get rid of the bacteria that normally live on the skin and can keep them away for about 24 hours. To clean your skin with CHG, you may be given: A CHG solution to use in the shower or as part of a sponge bath. A prepackaged cloth that contains CHG. Cleaning your skin with CHG may help lower the risk for infection: While you are staying in the intensive care unit of the hospital. If you have a vascular access, such as a central line, to provide short-term or long-term access to your veins. If you have a catheter to drain urine from your bladder. If you are on a ventilator. A ventilator is a machine that helps you breathe by moving air in and out of  your lungs. After surgery. What are the risks? Risks of using CHG include: A skin reaction. Hearing loss, if CHG gets in your ears and you have a perforated eardrum. Eye injury, if CHG gets in your eyes and is not rinsed out. The CHG product catching fire. Make sure that you avoid smoking and flames after applying CHG to your skin. Do not use CHG: If you have a chlorhexidine allergy or have previously reacted to chlorhexidine. On babies younger than 25 months of age. How to use CHG solution Use CHG only as told by your health care provider, and follow the instructions on the label. Use the full amount of CHG as directed. Usually, this is one bottle. During a shower Follow these steps when using CHG solution during a shower (unless your health care provider gives you different instructions): Start the shower. Use your normal soap and shampoo to wash your face and hair. Turn off the shower or move out of the shower stream. Pour the CHG onto a clean washcloth. Do not use any type of brush or rough-edged sponge. Starting at your neck, lather your body down to your toes. Make sure you follow these instructions: If you will be having surgery, pay special attention to the part of your body where you will be having  surgery. Scrub this area for at least 1 minute. Do not use CHG on your head or face. If the solution gets into your ears or eyes, rinse them well with water. Avoid your genital area. Avoid any areas of skin that have broken skin, cuts, or scrapes. Scrub your back and under your arms. Make sure to wash skin folds. Let the lather sit on your skin for 1-2 minutes or as long as told by your health care provider. Thoroughly rinse your entire body in the shower. Make sure that all body creases and crevices are rinsed well. Dry off with a clean towel. Do not put any substances on your body afterward--such as powder, lotion, or perfume--unless you are told to do so by your health care  provider. Only use lotions that are recommended by the manufacturer. Put on clean clothes or pajamas. If it is the night before your surgery, sleep in clean sheets.  During a sponge bath Follow these steps when using CHG solution during a sponge bath (unless your health care provider gives you different instructions): Use your normal soap and shampoo to wash your face and hair. Pour the CHG onto a clean washcloth. Starting at your neck, lather your body down to your toes. Make sure you follow these instructions: If you will be having surgery, pay special attention to the part of your body where you will be having surgery. Scrub this area for at least 1 minute. Do not use CHG on your head or face. If the solution gets into your ears or eyes, rinse them well with water. Avoid your genital area. Avoid any areas of skin that have broken skin, cuts, or scrapes. Scrub your back and under your arms. Make sure to wash skin folds. Let the lather sit on your skin for 1-2 minutes or as long as told by your health care provider. Using a different clean, wet washcloth, thoroughly rinse your entire body. Make sure that all body creases and crevices are rinsed well. Dry off with a clean towel. Do not put any substances on your body afterward--such as powder, lotion, or perfume--unless you are told to do so by your health care provider. Only use lotions that are recommended by the manufacturer. Put on clean clothes or pajamas. If it is the night before your surgery, sleep in clean sheets. How to use CHG prepackaged cloths Only use CHG cloths as told by your health care provider, and follow the instructions on the label. Use the CHG cloth on clean, dry skin. Do not use the CHG cloth on your head or face unless your health care provider tells you to. When washing with the CHG cloth: Avoid your genital area. Avoid any areas of skin that have broken skin, cuts, or scrapes. Before surgery Follow these steps  when using a CHG cloth to clean before surgery (unless your health care provider gives you different instructions): Using the CHG cloth, vigorously scrub the part of your body where you will be having surgery. Scrub using a back-and-forth motion for 3 minutes. The area on your body should be completely wet with CHG when you are done scrubbing. Do not rinse. Discard the cloth and let the area air-dry. Do not put any substances on the area afterward, such as powder, lotion, or perfume. Put on clean clothes or pajamas. If it is the night before your surgery, sleep in clean sheets.  For general bathing Follow these steps when using CHG cloths for general bathing (unless your health  care provider gives you different instructions). Use a separate CHG cloth for each area of your body. Make sure you wash between any folds of skin and between your fingers and toes. Wash your body in the following order, switching to a new cloth after each step: The front of your neck, shoulders, and chest. Both of your arms, under your arms, and your hands. Your stomach and groin area, avoiding the genitals. Your right leg and foot. Your left leg and foot. The back of your neck, your back, and your buttocks. Do not rinse. Discard the cloth and let the area air-dry. Do not put any substances on your body afterward--such as powder, lotion, or perfume--unless you are told to do so by your health care provider. Only use lotions that are recommended by the manufacturer. Put on clean clothes or pajamas. Contact a health care provider if: Your skin gets irritated after scrubbing. You have questions about using your solution or cloth. You swallow any chlorhexidine. Call your local poison control center (1-516-703-7063 in the [place] Get help right away if: Your eyes itch badly, or they become very red or swollen. Your skin itches badly and is red or swollen. Your hearing changes. You have trouble seeing. You have swelling or  tingling in your mouth or throat. You have trouble breathing. These symptoms may represent a serious problem that is an emergency. Do not wait to see if the symptoms will go away. Get medical help right away. Call your local emergency services (911 in the [place] Do not drive yourself to the hospital. Summary Chlorhexidine gluconate (CHG) is a germ-killing (antiseptic) solution that is used to clean the skin. Cleaning your skin with CHG may help to lower your risk for infection. You may be given CHG to use for bathing. It may be in a bottle or in a prepackaged cloth to use on your skin. Carefully follow your health care provider's instructions and the instructions on the product label. Do not use CHG if you have a chlorhexidine allergy. Contact your health care provider if your skin gets irritated after scrubbing. This information is not intended to replace advice given to you by your health care provider. Make sure you discuss any questions you have with your health care provider. Document Revised: 10/26/2020 Document Reviewed: 10/26/2020 Elsevier Patient Education  [place]. Transurethral Resection of Bladder Tumor, Care After The following information offers guidance on how to care for yourself after your procedure. Your health care provider may also give you more specific instructions. If you have problems or questions, contact your health care provider. What can I expect after the procedure? After the procedure, it is common to have: A small amount of blood or small blood clots in your urine for up to 2 weeks. Soreness or mild pain from your catheter. After your catheter is removed, you may have mild soreness, especially when urinating. A need to urinate often. Pain in your lower abdomen. Follow these instructions at home: Medicines  Take over-the-counter and prescription medicines only as told by your health care provider. If you were prescribed an antibiotic medicine, take it  as told by your health care provider. Do not stop taking the antibiotic even if you start to feel better. Ask your health care provider if the medicine prescribed to you: Requires you to avoid driving or using machinery. Can cause constipation. You may need to take these actions to prevent or treat constipation: Drink enough fluid to keep your urine pale yellow. Take  over-the-counter or prescription medicines. Eat foods that are high in fiber, such as beans, whole grains, and fresh fruits and vegetables. Limit foods that are high in fat and processed sugars, such as fried or sweet foods. Activity  If you were given a sedative during the procedure, it can affect you for several hours. Do not drive or operate machinery until your health care provider says that it is safe. Rest as told by your health care provider. Avoid sitting for a long time without moving. Get up to take short walks every 1-2 hours. This is important to improve blood flow and breathing. Ask for help if you feel weak or unsteady. Do not lift anything that is heavier than 10 lb (4.5 kg), or the limit that you are told, until your health care provider says that it is safe. Avoid intense physical activity for as long as told by your health care provider. Do not have sex until your health care provider approves. Return to your normal activities as told by your health care provider. Ask your health care provider what activities are safe for you. General instructions If you have a catheter, follow instructions from your health care provider about caring for your catheter and your drainage bag. Do not drink alcohol for as long as told by your health care provider. This is especially important if you are taking prescription pain medicines. Do not use any products that contain nicotine or tobacco. These products include cigarettes, chewing tobacco, and vaping devices, such as e-cigarettes. If you need help quitting, ask your health care  provider. Wear compression stockings as told by your health care provider. These stockings help to prevent blood clots and reduce swelling in your legs. Keep all follow-up visits. This is important. You will need to be followed closely with regular checks of your bladder and urethra (cystoscopies) to make sure that the cancer does not come back. Contact a health care provider if: You have blood in your urine for more than 2 weeks. You become constipated. Signs of constipation may include: Having fewer than three bowel movements in a week. Difficulty having a bowel movement. Stools that are dry, hard, or larger than normal. You have a urinary catheter in place, and you have: Spasms or pain. Problems with your catheter or your catheter is blocked. Your catheter has been taken out but you are unable to urinate. You have signs of infection, such as: Fever or chills. Cloudy or bad-smelling urine. Get help right away if: You have severe abdominal pain that gets worse or does not improve with medicine. You have a lot of large blood clots in your urine. You develop swelling or pain in your leg. You have difficulty breathing. These symptoms may be an emergency. Get help right away. Call 911. Do not wait to see if the symptoms will go away. Do not drive yourself to the hospital. Summary After your procedure, it is common to have a small amount of blood or small blood clots in your urine, soreness or mild pain from your catheter, and pain in your lower abdomen. Take over-the-counter and prescription medicines only as told by your health care provider. Rest as told by your health care provider. Follow your health care provider's instructions about returning to normal activities. Ask what activities are safe for you. If you have a catheter, follow instructions from your health care provider about caring for your catheter and your drainage bag. This information is not intended to replace advice given  to  you by your health care provider. Make sure you discuss any questions you have with your health care provider. Document Revised: 08/20/2021 Document Reviewed: 08/20/2021 Elsevier Patient Education  [place] Anesthesia, Adult, Care After This sheet gives you information about how to care for yourself after your procedure. Your health care provider may also give you more specific instructions. If you have problems or questions, contact your health care provider. What can I expect after the procedure? After the procedure, the following side effects are common: Pain or discomfort at the IV site. Nausea. Vomiting. Sore throat. Trouble concentrating. Feeling cold or chills. Feeling weak or tired. Sleepiness and fatigue. Soreness and body aches. These side effects can affect parts of the body that were not involved in surgery. Follow these instructions at home: For the time period you were told by your health care provider:  Rest. Do not participate in activities where you could fall or become injured. Do not drive or use machinery. Do not drink alcohol. Do not take sleeping pills or medicines that cause drowsiness. Do not make important decisions or sign legal documents. Do not take care of children on your own. Eating and drinking Follow any instructions from your health care provider about eating or drinking restrictions. When you feel hungry, start by eating small amounts of foods that are soft and easy to digest (bland), such as toast. Gradually return to your regular diet. Drink enough fluid to keep your urine pale yellow. If you vomit, rehydrate by drinking water, juice, or clear broth. General instructions If you have sleep apnea, surgery and certain medicines can increase your risk for breathing problems. Follow instructions from your health care provider about wearing your sleep device: Anytime you are sleeping, including during daytime naps. While taking  prescription pain medicines, sleeping medicines, or medicines that make you drowsy. Have a responsible adult stay with you for the time you are told. It is important to have someone help care for you until you are awake and alert. Return to your normal activities as told by your health care provider. Ask your health care provider what activities are safe for you. Take over-the-counter and prescription medicines only as told by your health care provider. If you smoke, do not smoke without supervision. Keep all follow-up visits as told by your health care provider. This is important. Contact a health care provider if: You have nausea or vomiting that does not get better with medicine. You cannot eat or drink without vomiting. You have pain that does not get better with medicine. You are unable to pass urine. You develop a skin rash. You have a fever. You have redness around your IV site that gets worse. Get help right away if: You have difficulty breathing. You have chest pain. You have blood in your urine or stool, or you vomit blood. Summary After the procedure, it is common to have a sore throat or nausea. It is also common to feel tired. Have a responsible adult stay with you for the time you are told. It is important to have someone help care for you until you are awake and alert. When you feel hungry, start by eating small amounts of foods that are soft and easy to digest (bland), such as toast. Gradually return to your regular diet. Drink enough fluid to keep your urine pale yellow. Return to your normal activities as told by your health care provider. Ask your health care provider what activities are safe for you. This  information is not intended to replace advice given to you by your health care provider. Make sure you discuss any questions you have with your health care provider. Document Revised: 04/30/2020 Document Reviewed: 11/28/2019 Elsevier Patient Education  [place].

## 2022-03-02 ENCOUNTER — Encounter (HOSPITAL_COMMUNITY)
Admission: RE | Admit: 2022-03-02 | Discharge: 2022-03-02 | Disposition: A | Discharge status: Home / Self Care | Payer: 59 | Source: Ambulatory Visit | Attending: Urology | Admitting: Urology

## 2022-03-02 ENCOUNTER — Encounter (HOSPITAL_COMMUNITY): Payer: Self-pay

## 2022-03-02 VITALS — BP 146/88 | HR 89 | Temp 97.9°F | Ht 70.0 in | Wt 277.0 lb

## 2022-03-02 DIAGNOSIS — T502X5A Adverse effect of carbonic-anhydrase inhibitors, benzothiadiazides and other diuretics, initial encounter: Secondary | ICD-10-CM | POA: Diagnosis not present

## 2022-03-02 DIAGNOSIS — I10 Essential (primary) hypertension: Secondary | ICD-10-CM | POA: Insufficient documentation

## 2022-03-02 DIAGNOSIS — Z01818 Encounter for other preprocedural examination: Secondary | ICD-10-CM | POA: Diagnosis not present

## 2022-03-02 HISTORY — DX: Sleep apnea, unspecified: G47.30

## 2022-03-02 HISTORY — DX: Acute myocardial infarction, unspecified: I21.9

## 2022-03-02 LAB — BASIC METABOLIC PANEL
Anion gap: 9 (ref 5–15)
BUN: 20 mg/dL (ref 8–23)
CO2: 26 mmol/L (ref 22–32)
Calcium: 9 mg/dL (ref 8.9–10.3)
Chloride: 102 mmol/L (ref 98–111)
Creatinine, Ser: 0.87 mg/dL (ref 0.61–1.24)
GFR, Estimated: >60 mL/min (ref >60)
Glucose, Bld: 138 mg/dL — ABNORMAL HIGH (ref 70–99)
Potassium: 3.7 mmol/L (ref 3.5–5.1)
Sodium: 137 mmol/L (ref 135–145)

## 2022-03-03 ENCOUNTER — Ambulatory Visit (HOSPITAL_COMMUNITY)
Admission: RE | Admit: 2022-03-03 | Discharge: 2022-03-03 | Disposition: A | Discharge status: Home / Self Care | Payer: 59 | Attending: Urology | Admitting: Urology

## 2022-03-03 ENCOUNTER — Encounter (HOSPITAL_COMMUNITY): Payer: Self-pay | Admitting: Urology

## 2022-03-03 ENCOUNTER — Ambulatory Visit (HOSPITAL_COMMUNITY): Payer: 59 | Admitting: Anesthesiology

## 2022-03-03 ENCOUNTER — Encounter (HOSPITAL_COMMUNITY)
Admission: RE | Disposition: A | Discharge status: Home / Self Care | Payer: Self-pay | Source: Home / Self Care | Attending: Urology

## 2022-03-03 ENCOUNTER — Ambulatory Visit (HOSPITAL_COMMUNITY): Payer: 59

## 2022-03-03 ENCOUNTER — Other Ambulatory Visit: Payer: Self-pay

## 2022-03-03 ENCOUNTER — Ambulatory Visit (HOSPITAL_BASED_OUTPATIENT_CLINIC_OR_DEPARTMENT_OTHER): Payer: 59 | Admitting: Anesthesiology

## 2022-03-03 DIAGNOSIS — I252 Old myocardial infarction: Secondary | ICD-10-CM | POA: Diagnosis not present

## 2022-03-03 DIAGNOSIS — D494 Neoplasm of unspecified behavior of bladder: Secondary | ICD-10-CM

## 2022-03-03 DIAGNOSIS — R310 Gross hematuria: Secondary | ICD-10-CM | POA: Insufficient documentation

## 2022-03-03 DIAGNOSIS — C671 Malignant neoplasm of dome of bladder: Secondary | ICD-10-CM | POA: Diagnosis not present

## 2022-03-03 DIAGNOSIS — M1990 Unspecified osteoarthritis, unspecified site: Secondary | ICD-10-CM | POA: Diagnosis not present

## 2022-03-03 DIAGNOSIS — I10 Essential (primary) hypertension: Secondary | ICD-10-CM | POA: Insufficient documentation

## 2022-03-03 DIAGNOSIS — G4730 Sleep apnea, unspecified: Secondary | ICD-10-CM | POA: Diagnosis not present

## 2022-03-03 DIAGNOSIS — G4733 Obstructive sleep apnea (adult) (pediatric): Secondary | ICD-10-CM | POA: Diagnosis not present

## 2022-03-03 DIAGNOSIS — I739 Peripheral vascular disease, unspecified: Secondary | ICD-10-CM | POA: Insufficient documentation

## 2022-03-03 DIAGNOSIS — C679 Malignant neoplasm of bladder, unspecified: Secondary | ICD-10-CM | POA: Diagnosis not present

## 2022-03-03 HISTORY — PX: CYSTOSCOPY W/ RETROGRADES: SHX1426

## 2022-03-03 HISTORY — PX: BLADDER INSTILLATION: SHX6893

## 2022-03-03 HISTORY — PX: TRANSURETHRAL RESECTION OF BLADDER TUMOR: SHX2575

## 2022-03-03 SURGERY — CYSTOSCOPY, WITH RETROGRADE PYELOGRAM
Anesthesia: General | Site: Bladder

## 2022-03-03 MED ORDER — ORAL CARE MOUTH RINSE: 15.0000 mL | Freq: Once | OROMUCOSAL | Status: AC

## 2022-03-03 MED ORDER — OXYCODONE-ACETAMINOPHEN 5-325 MG PO TABS: 1.0000 | ORAL_TABLET | ORAL | 0 refills | Status: DC | PRN

## 2022-03-03 MED ORDER — CEFAZOLIN SODIUM-DEXTROSE 2-4 GM/100ML-% IV SOLN
2.0000 g | INTRAVENOUS | Status: AC
  Administered 2022-03-03: 2 g via INTRAVENOUS
  Filled 2022-03-03: qty 100

## 2022-03-03 MED ORDER — SODIUM CHLORIDE 0.9 % IR SOLN
Status: DC | PRN
  Administered 2022-03-03 (×2): 3000 mL

## 2022-03-03 MED ORDER — DEXAMETHASONE SODIUM PHOSPHATE 10 MG/ML IJ SOLN
INTRAMUSCULAR | Status: DC | PRN
  Administered 2022-03-03: 5 mg via INTRAVENOUS

## 2022-03-03 MED ORDER — PROPOFOL 10 MG/ML IV BOLUS
INTRAVENOUS | Status: DC | PRN
  Administered 2022-03-03: 30 mg via INTRAVENOUS
  Administered 2022-03-03: 170 mg via INTRAVENOUS

## 2022-03-03 MED ORDER — ONDANSETRON HCL 4 MG/2ML IJ SOLN
INTRAMUSCULAR | Status: DC | PRN
  Administered 2022-03-03: 4 mg via INTRAVENOUS

## 2022-03-03 MED ORDER — DEXAMETHASONE SODIUM PHOSPHATE 10 MG/ML IJ SOLN
INTRAMUSCULAR | Status: AC
  Filled 2022-03-03: qty 1

## 2022-03-03 MED ORDER — FENTANYL CITRATE (PF) 100 MCG/2ML IJ SOLN
INTRAMUSCULAR | Status: DC | PRN
  Administered 2022-03-03 (×2): 50 ug via INTRAVENOUS

## 2022-03-03 MED ORDER — CHLORHEXIDINE GLUCONATE 0.12 % MT SOLN
15.0000 mL | Freq: Once | OROMUCOSAL | Status: AC
  Administered 2022-03-03: 15 mL via OROMUCOSAL

## 2022-03-03 MED ORDER — HYDRALAZINE HCL 20 MG/ML IJ SOLN
INTRAMUSCULAR | Status: AC
  Filled 2022-03-03: qty 1

## 2022-03-03 MED ORDER — FENTANYL CITRATE (PF) 100 MCG/2ML IJ SOLN
INTRAMUSCULAR | Status: AC
  Filled 2022-03-03: qty 2

## 2022-03-03 MED ORDER — SODIUM CHLORIDE (PF) 0.9 % IJ SOLN
INTRAMUSCULAR | Status: AC
  Filled 2022-03-03: qty 10

## 2022-03-03 MED ORDER — DIATRIZOATE MEGLUMINE 30 % UR SOLN
URETHRAL | Status: AC
  Filled 2022-03-03: qty 100

## 2022-03-03 MED ORDER — DIATRIZOATE MEGLUMINE 30 % UR SOLN
URETHRAL | Status: DC | PRN
  Administered 2022-03-03: 100 mL via URETHRAL

## 2022-03-03 MED ORDER — PHENYLEPHRINE HCL (PRESSORS) 10 MG/ML IV SOLN
INTRAVENOUS | Status: DC | PRN
  Administered 2022-03-03: 80 ug via INTRAVENOUS

## 2022-03-03 MED ORDER — HYDROMORPHONE HCL 1 MG/ML IJ SOLN
0.2500 mg | INTRAMUSCULAR | Status: DC | PRN
  Administered 2022-03-03 (×2): 0.5 mg via INTRAVENOUS
  Filled 2022-03-03 (×2): qty 0.5

## 2022-03-03 MED ORDER — MIDAZOLAM HCL 2 MG/2ML IJ SOLN
2.0000 mg | Freq: Once | INTRAMUSCULAR | Status: AC
  Administered 2022-03-03: 2 mg via INTRAVENOUS

## 2022-03-03 MED ORDER — MIDAZOLAM HCL 2 MG/2ML IJ SOLN
INTRAMUSCULAR | Status: AC
  Filled 2022-03-03: qty 2

## 2022-03-03 MED ORDER — LACTATED RINGERS IV SOLN: INTRAVENOUS | Status: DC

## 2022-03-03 MED ORDER — LIDOCAINE HCL (CARDIAC) PF 50 MG/5ML IV SOSY
PREFILLED_SYRINGE | INTRAVENOUS | Status: DC | PRN
  Administered 2022-03-03: 60 mg via INTRAVENOUS

## 2022-03-03 MED ORDER — STERILE WATER FOR IRRIGATION IR SOLN
Status: DC | PRN
  Administered 2022-03-03: 500 mL

## 2022-03-03 MED ORDER — GEMCITABINE CHEMO FOR BLADDER INSTILLATION 2000 MG
INTRAVENOUS | Status: DC | PRN
  Administered 2022-03-03: 2000 mg via INTRAVESICAL

## 2022-03-03 MED ORDER — GEMCITABINE CHEMO FOR BLADDER INSTILLATION 2000 MG
2000.0000 mg | Freq: Once | INTRAVENOUS | Status: DC
  Filled 2022-03-03: qty 52.6

## 2022-03-03 MED ORDER — HYDRALAZINE HCL 20 MG/ML IJ SOLN
INTRAMUSCULAR | Status: DC | PRN
  Administered 2022-03-03: 4 mg via INTRAVENOUS

## 2022-03-03 MED ORDER — ONDANSETRON HCL 4 MG/2ML IJ SOLN: 4.0000 mg | Freq: Once | INTRAMUSCULAR | Status: DC | PRN

## 2022-03-03 SURGICAL SUPPLY — 27 items
BAG DRAIN URO TABLE W/ADPT NS (BAG) ×3 IMPLANT
BAG DRN 8 ADPR NS SKTRN CSTL (BAG) ×2
BAG DRN RND TRDRP ANRFLXCHMBR (UROLOGICAL SUPPLIES) ×2
BAG HAMPER (MISCELLANEOUS) ×3 IMPLANT
BAG URINE DRAIN 2000ML AR STRL (UROLOGICAL SUPPLIES) ×3 IMPLANT
CATH FOLEY 3WAY 30CC 22F (CATHETERS) ×1 IMPLANT
CATH INTERMIT  6FR 70CM (CATHETERS) ×3 IMPLANT
CLOTH BEACON ORANGE TIMEOUT ST (SAFETY) ×3 IMPLANT
ELECT LOOP 22F BIPOLAR SML (ELECTROSURGICAL) ×3
ELECTRODE LOOP 22F BIPOLAR SML (ELECTROSURGICAL) ×2 IMPLANT
GLOVE BIO SURGEON STRL SZ8 (GLOVE) ×3 IMPLANT
GLOVE BIOGEL PI IND STRL 7.0 (GLOVE) ×4 IMPLANT
GLOVE BIOGEL PI INDICATOR 7.0 (GLOVE) ×2
GOWN STRL REUS W/TWL LRG LVL3 (GOWN DISPOSABLE) ×3 IMPLANT
GOWN STRL REUS W/TWL XL LVL3 (GOWN DISPOSABLE) ×3 IMPLANT
GUIDEWIRE STR DUAL SENSOR (WIRE) ×1 IMPLANT
IV NS IRRIG 3000ML ARTHROMATIC (IV SOLUTION) ×6 IMPLANT
KIT TURNOVER CYSTO (KITS) ×3 IMPLANT
MANIFOLD NEPTUNE II (INSTRUMENTS) ×3 IMPLANT
PACK CYSTO (CUSTOM PROCEDURE TRAY) ×3 IMPLANT
PAD ARMBOARD 7.5X6 YLW CONV (MISCELLANEOUS) ×3 IMPLANT
PLUG CATH AND CAP STER (CATHETERS) ×2 IMPLANT
SYR 30ML LL (SYRINGE) ×1 IMPLANT
SYR TOOMEY IRRIG 70ML (MISCELLANEOUS) ×3
SYRINGE TOOMEY IRRIG 70ML (MISCELLANEOUS) IMPLANT
TOWEL OR 17X26 4PK STRL BLUE (TOWEL DISPOSABLE) ×3 IMPLANT
WATER STERILE IRR 500ML POUR (IV SOLUTION) ×3 IMPLANT

## 2022-03-03 NOTE — Anesthesia Postprocedure Evaluation (Signed)
Anesthesia Post Note  Patient: Rodney Moreno  Procedure(s) Performed: CYSTOSCOPY WITH RETROGRADE PYELOGRAM (Bilateral: Bladder) TRANSURETHRAL RESECTION OF BLADDER TUMOR (TURBT) (Bladder) BLADDER INSTILLATION-GEMCITABINE (Bladder)  Patient location during evaluation: Phase II Anesthesia Type: General Level of consciousness: awake and alert Pain management: pain level controlled Vital Signs Assessment: post-procedure vital signs reviewed and stable Respiratory status: spontaneous breathing, nonlabored ventilation and respiratory function stable Cardiovascular status: blood pressure returned to baseline and stable Postop Assessment: no apparent nausea or vomiting Anesthetic complications: no   No notable events documented.   Last Vitals:  Vitals:   03/03/22 1115 03/03/22 1130  BP: 138/84 135/79  Pulse: 74 72  Resp: 12 (!) 8  Temp:    SpO2: 99% 96%    Last Pain:  Vitals:   03/03/22 1115  TempSrc:   PainSc: 3                  Aeon Kessner C Zyriah Mask

## 2022-03-03 NOTE — Transfer of Care (Signed)
Immediate Anesthesia Transfer of Care Note  Patient: Rodney Moreno  Procedure(s) Performed: CYSTOSCOPY WITH RETROGRADE PYELOGRAM (Bilateral: Bladder) TRANSURETHRAL RESECTION OF BLADDER TUMOR (TURBT) (Bladder) BLADDER INSTILLATION-GEMCITABINE (Bladder)  Patient Location: PACU  Anesthesia Type:General  Level of Consciousness: awake and patient cooperative  Airway & Oxygen Therapy: Patient Spontanous Breathing and Patient connected to nasal cannula oxygen  Post-op Assessment: Report given to RN and Post -op Vital signs reviewed and stable  Post vital signs: Reviewed and stable  Last Vitals:  Vitals Value Taken Time  BP 136/86 03/03/22 1049  Temp 97.6 1050  Pulse 79 03/03/22 1050  Resp 13 03/03/22 1050  SpO2 97 % 03/03/22 1050  Vitals shown include unvalidated device data.  Last Pain:  Vitals:   03/03/22 0807  TempSrc: Oral  PainSc: 0-No pain         Complications: No notable events documented.

## 2022-03-03 NOTE — Interval H&P Note (Signed)
History and Physical Interval Note:  03/03/2022 8:58 AM  Rodney Moreno  has presented today for surgery, with the diagnosis of bladder tumor.  The various methods of treatment have been discussed with the patient and family. After consideration of risks, benefits and other options for treatment, the patient has consented to  Procedure(s): CYSTOSCOPY WITH RETROGRADE PYELOGRAM (Bilateral) TRANSURETHRAL RESECTION OF BLADDER TUMOR (TURBT) (N/A) BLADDER INSTILLATION-GEMCITABINE (N/A) as a surgical intervention.  The patient's history has been reviewed, patient examined, no change in status, stable for surgery.  I have reviewed the patient's chart and labs.  Questions were answered to the patient's satisfaction.     Nicolette Bang

## 2022-03-03 NOTE — Op Note (Signed)
.  Preoperative diagnosis: bladder tumor  Postoperative diagnosis: Same  Procedure: 1 cystoscopy 2. bilateral retrograde pyelography 3.  Intraoperative fluoroscopy, under one hour, with interpretation 4.  Transurethral resection of bladder tumor, medium 5. Instillation of bladder chemotherapy agent  Attending: Rosie Fate  Anesthesia: General  Estimated blood loss: Minimal  Drains: 22 French foley  Specimens: bladder tumor  Antibiotics: ancef  Findings: 4cm papillary dome tumor.  Ureteral orifices in normal anatomic location. No hydronephrosis or filling defects in either collecting system  Indications: Patient is a 71 year old male with a history of bladder tumor and gross hematuria.  After discussing treatment options, they decided proceed with transurethral resection of a bladder tumor.  Procedure in detail: The patient was brought to the operating room and a brief timeout was done to ensure correct patient, correct procedure, correct site.  General anesthesia was administered patient was placed in dorsal lithotomy position.  Their genitalia was then prepped and draped in usual sterile fashion.  A rigid 63 French cystoscope was passed in the urethra and the bladder.  Bladder was inspected and we noted a 4cm bladder tumor.  the ureteral orifices were in the normal orthotopic locations.  a 6 french ureteral catheter was then instilled into the left ureteral orifice.  a gentle retrograde was obtained and findings noted above. We then turned our attention to the right side. a 6 french ureteral catheter was then instilled into the right ureteral orifice.  a gentle retrograde was obtained and findings noted above. We then removed the cystoscope and placed a resectoscope into the bladder.  Using the bipolar resectoscope we removed the bladder tumor down to the base. A subsequent muscle deep biopsy was then taken. Hemostasis was then obtained with electrocautery. We then removed the bladder  tumor chips and sent them for pathology. We then re-inspected the bladder and found no residula bleeding.  the bladder was then drained, a 22 French foley was placed. We then instilled 2g of gemcitebine into the bladder and capped the foley.  This concluded the procedure which was well tolerated by patient.  Complications: None  Condition: Stable, extubated, transferred to PACU  Plan: Patient is to have his bladder drained in 1 hour. He is to be discharged home and followup in 7 days for foley catheter removal and pathology discussion.

## 2022-03-03 NOTE — Anesthesia Procedure Notes (Signed)
Procedure Name: LMA Insertion Date/Time: 03/03/2022 9:40 AM  Performed by: Vista Deck, CRNAPre-anesthesia Checklist: Patient identified, Patient being monitored, Timeout performed, Emergency Drugs available and Suction available Patient Re-evaluated:Patient Re-evaluated prior to induction Oxygen Delivery Method: Circle System Utilized Preoxygenation: Pre-oxygenation with 100% oxygen Induction Type: IV induction Ventilation: Mask ventilation without difficulty LMA: LMA inserted LMA Size: 4.0 Number of attempts: 1 Placement Confirmation: positive ETCO2 and breath sounds checked- equal and bilateral Tube secured with: Tape Dental Injury: Teeth and Oropharynx as per pre-operative assessment

## 2022-03-03 NOTE — Anesthesia Preprocedure Evaluation (Addendum)
Anesthesia Evaluation  Patient identified by MRN, date of birth, ID band Patient awake    Reviewed: Allergy & Precautions, NPO status , Patient's Chart, lab work & pertinent test results  History of Anesthesia Complications Negative for: history of anesthetic complications  Airway Mallampati: III  TM Distance: >3 FB Neck ROM: Full   Comment: Neck sx Dental  (+) Dental Advisory Given, Missing   Pulmonary sleep apnea and Continuous Positive Airway Pressure Ventilation ,    Pulmonary exam normal breath sounds clear to auscultation       Cardiovascular Exercise Tolerance: Good hypertension, Pt. 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)  Normal cardiovascular exam Rhythm:Regular Rate:Normal     Neuro/Psych negative neurological ROS  negative psych ROS   GI/Hepatic negative GI ROS, Neg liver ROS,   Endo/Other  negative endocrine ROS  Renal/GU negative Renal ROS  negative genitourinary   Musculoskeletal  (+) Arthritis  (neck sx),   Abdominal   Peds negative pediatric ROS (+)  Hematology negative hematology ROS (+)   Anesthesia Other Findings Neck sx and hematoma after sx  Reproductive/Obstetrics negative OB ROS                            Anesthesia Physical Anesthesia Plan  ASA: 3  Anesthesia Plan: General   Post-op Pain Management: Dilaudid IV   Induction: Intravenous  PONV Risk Score and Plan: 4 or greater and Ondansetron and Dexamethasone  Airway Management Planned: Oral ETT and LMA  Additional Equipment:   Intra-op Plan:   Post-operative Plan: Extubation in OR  Informed Consent: I have reviewed the patients History and Physical, chart, labs and discussed the procedure including the risks, benefits and alternatives for the proposed anesthesia with the patient or authorized representative who has indicated his/her understanding and acceptance.      Dental advisory given  Plan Discussed with: CRNA and Surgeon  Anesthesia Plan Comments:         Anesthesia Quick Evaluation

## 2022-03-05 LAB — SURGICAL PATHOLOGY

## 2022-03-08 ENCOUNTER — Encounter (HOSPITAL_COMMUNITY): Payer: Self-pay | Admitting: Urology

## 2022-03-09 ENCOUNTER — Ambulatory Visit (INDEPENDENT_AMBULATORY_CARE_PROVIDER_SITE_OTHER): Payer: 59 | Admitting: Urology

## 2022-03-09 ENCOUNTER — Encounter: Payer: Self-pay | Admitting: Urology

## 2022-03-09 VITALS — BP 129/84 | HR 93

## 2022-03-09 DIAGNOSIS — D494 Neoplasm of unspecified behavior of bladder: Secondary | ICD-10-CM

## 2022-03-09 DIAGNOSIS — C679 Malignant neoplasm of bladder, unspecified: Secondary | ICD-10-CM | POA: Diagnosis not present

## 2022-03-09 NOTE — Patient Instructions (Signed)

## 2022-03-09 NOTE — Progress Notes (Signed)
03/09/2022 9:26 AM   Junie Panning May 27, 1951 [id number]  Referring provider: Redmond School, MD [address] [place],  [place] [address]  Followup bladder tumor resection   HPI: [name] is a 71yo here for followup after bladder tumor resection. 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 Foley removed today. No hematuria.    PMH: Past Medical History:  Diagnosis Date   Hypertension    Myocardial infarction [facility])    Sleep apnea     Surgical History: Past Surgical History:  Procedure Laterality Date   BACK SURGERY  2003   BLADDER INSTILLATION N/A 03/03/2022   Procedure: BLADDER INSTILLATION-GEMCITABINE;  Surgeon: Cleon Gustin, MD;  Location: [facility];  Service: Urology;  Laterality: N/A;   CYSTOSCOPY W/ RETROGRADES Bilateral 03/03/2022   Procedure: CYSTOSCOPY WITH RETROGRADE PYELOGRAM;  Surgeon: Cleon Gustin, MD;  Location: [facility];  Service: Urology;  Laterality: Bilateral;   MOUTH SURGERY     TRANSURETHRAL RESECTION OF BLADDER TUMOR N/A 03/03/2022   Procedure: TRANSURETHRAL RESECTION OF BLADDER TUMOR (TURBT);  Surgeon: Cleon Gustin, MD;  Location: [facility];  Service: Urology;  Laterality: N/A;    Home Medications:  Allergies as of 03/09/2022   No Known Allergies      Medication List        Accurate as of March 09, 2022  9:26 AM. If you have any questions, ask your nurse or doctor.          allopurinol 300 MG tablet Commonly known as: ZYLOPRIM Take 300 mg by mouth daily.   aspirin EC 81 MG tablet Take 81 mg by mouth daily. Swallow whole.   enalapril-hydrochlorothiazide 10-25 MG tablet Commonly known as: VASERETIC Take 1 tablet by mouth daily.   ezetimibe 10 MG tablet Commonly known as: ZETIA Take 10 mg by mouth daily.   oxyCODONE-acetaminophen 5-325 MG tablet Commonly known as: Percocet Take 1 tablet by mouth every 4 (four) hours as needed for severe pain.        Allergies: No Known  Allergies  Family History: Family History  Problem Relation Age of Onset   Cancer Father     Social History:  reports that he has never smoked. He has never used smokeless tobacco. He reports that he does not drink alcohol and does not use drugs.  ROS: All other review of systems were reviewed and are negative except what is noted above in HPI  Physical Exam: There were no vitals taken for this visit.  Constitutional:  Alert and oriented, No acute distress. HEENT: [place] AT, moist mucus membranes.  Trachea midline, no masses. Cardiovascular: No clubbing, cyanosis, or edema. Respiratory: Normal respiratory effort, no increased work of breathing. GI: Abdomen is soft, nontender, nondistended, no abdominal masses GU: No CVA tenderness.  Lymph: No cervical or inguinal lymphadenopathy. Skin: No rashes, bruises or suspicious lesions. Neurologic: Grossly intact, no focal deficits, moving all 4 extremities. Psychiatric: Normal mood and affect.  Laboratory Data: Lab Results  Component Value Date   WBC 7.3 06/23/2008   HGB 14.5 06/23/2008   HCT 42.3 06/23/2008   MCV 92.4 06/23/2008   PLT 232 06/23/2008    Lab Results  Component Value Date   CREATININE 0.87 03/02/2022    No results found for: "PSA"  No results found for: "TESTOSTERONE"  No results found for: "HGBA1C"  Urinalysis    Component Value Date/Time   COLORURINE YELLOW 06/22/2008 1924   APPEARANCEUR Clear 02/16/2022 1452   LABSPEC >  1.030 (H) 06/22/2008 1924   PHURINE 6.0 06/22/2008 1924   GLUCOSEU Negative 02/16/2022 1452   HGBUR NEGATIVE 06/22/2008 1924   BILIRUBINUR Negative 02/16/2022 1452   [place] 06/22/2008 1924   PROTEINUR Trace (A) 02/16/2022 [place] 06/22/2008 1924   UROBILINOGEN 0.2 06/22/2008 1924   NITRITE Negative 02/16/2022 1452   NITRITE NEGATIVE 06/22/2008 1924   LEUKOCYTESUR Negative 02/16/2022 1452    Lab Results  Component Value Date   LABMICR Comment  02/16/2022    Pertinent Imaging:  No results found for this or any previous visit.  No results found for this or any previous visit.  No results found for this or any previous visit.  No results found for this or any previous visit.  No results found for this or any previous visit.  No results found for this or any previous visit.  No results found for this or any previous visit.  No results found for this or any previous visit.   Assessment & Plan:    1. Bladder cancer I discussed the natural history of high grade bladder cancer including restaging resection of the tumor site. After discussing the management the patient elects to proceed with surgery. Risks/benefits/alternatives discussed    No follow-ups on file.  Nicolette Bang, MD  [facility] [place]

## 2022-03-09 NOTE — H&P (View-Only) (Signed)
03/09/2022 9:26 AM   Rodney Moreno [birth year] [id number]  Referring provider: Redmond School, MD [address] [place],  [place] [address]  Followup bladder tumor resection   HPI: Rodney Moreno is a 71yo here for followup after bladder tumor resection. 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 Foley removed today. No hematuria.    PMH: Past Medical History:  Diagnosis Date   Hypertension    Myocardial infarction [facility])    Sleep apnea     Surgical History: Past Surgical History:  Procedure Laterality Date   BACK SURGERY  2003   BLADDER INSTILLATION N/A 03/03/2022   Procedure: BLADDER INSTILLATION-GEMCITABINE;  Surgeon: Cleon Gustin, MD;  Location: [facility];  Service: Urology;  Laterality: N/A;   CYSTOSCOPY W/ RETROGRADES Bilateral 03/03/2022   Procedure: CYSTOSCOPY WITH RETROGRADE PYELOGRAM;  Surgeon: Cleon Gustin, MD;  Location: [facility];  Service: Urology;  Laterality: Bilateral;   MOUTH SURGERY     TRANSURETHRAL RESECTION OF BLADDER TUMOR N/A 03/03/2022   Procedure: TRANSURETHRAL RESECTION OF BLADDER TUMOR (TURBT);  Surgeon: Cleon Gustin, MD;  Location: [facility];  Service: Urology;  Laterality: N/A;    Home Medications:  Allergies as of 03/09/2022   No Known Allergies      Medication List        Accurate as of March 09, 2022  9:26 AM. If you have any questions, ask your nurse or doctor.          allopurinol 300 MG tablet Commonly known as: ZYLOPRIM Take 300 mg by mouth daily.   aspirin EC 81 MG tablet Take 81 mg by mouth daily. Swallow whole.   enalapril-hydrochlorothiazide 10-25 MG tablet Commonly known as: VASERETIC Take 1 tablet by mouth daily.   ezetimibe 10 MG tablet Commonly known as: ZETIA Take 10 mg by mouth daily.   oxyCODONE-acetaminophen 5-325 MG tablet Commonly known as: Percocet Take 1 tablet by mouth every 4 (four) hours as needed for severe pain.        Allergies: No Known  Allergies  Family History: Family History  Problem Relation Age of Onset   Cancer Father     Social History:  reports that he has never smoked. He has never used smokeless tobacco. He reports that he does not drink alcohol and does not use drugs.  ROS: All other review of systems were reviewed and are negative except what is noted above in HPI  Physical Exam: There were no vitals taken for this visit.  Constitutional:  Alert and oriented, No acute distress. HEENT: [place] AT, moist mucus membranes.  Trachea midline, no masses. Cardiovascular: No clubbing, cyanosis, or edema. Respiratory: Normal respiratory effort, no increased work of breathing. GI: Abdomen is soft, nontender, nondistended, no abdominal masses GU: No CVA tenderness.  Lymph: No cervical or inguinal lymphadenopathy. Skin: No rashes, bruises or suspicious lesions. Neurologic: Grossly intact, no focal deficits, moving all 4 extremities. Psychiatric: Normal mood and affect.  Laboratory Data: Lab Results  Component Value Date   WBC 7.3 06/23/2008   HGB 14.5 06/23/2008   HCT 42.3 06/23/2008   MCV 92.4 06/23/2008   PLT 232 06/23/2008    Lab Results  Component Value Date   CREATININE 0.87 03/02/2022    No results found for: "PSA"  No results found for: "TESTOSTERONE"  No results found for: "HGBA1C"  Urinalysis    Component Value Date/Time   COLORURINE YELLOW 06/22/2008 1924   APPEARANCEUR Clear 02/16/2022 1452   LABSPEC >  1.030 (H) 06/22/2008 1924   PHURINE 6.0 06/22/2008 1924   GLUCOSEU Negative 02/16/2022 1452   HGBUR NEGATIVE 06/22/2008 1924   BILIRUBINUR Negative 02/16/2022 1452   [place] 06/22/2008 1924   PROTEINUR Trace (A) 02/16/2022 [place] 06/22/2008 1924   UROBILINOGEN 0.2 06/22/2008 1924   NITRITE Negative 02/16/2022 1452   NITRITE NEGATIVE 06/22/2008 1924   LEUKOCYTESUR Negative 02/16/2022 1452    Lab Results  Component Value Date   LABMICR Comment  02/16/2022    Pertinent Imaging:  No results found for this or any previous visit.  No results found for this or any previous visit.  No results found for this or any previous visit.  No results found for this or any previous visit.  No results found for this or any previous visit.  No results found for this or any previous visit.  No results found for this or any previous visit.  No results found for this or any previous visit.   Assessment & Plan:    1. Bladder cancer I discussed the natural history of high grade bladder cancer including restaging resection of the tumor site. After discussing the management the patient elects to proceed with surgery. Risks/benefits/alternatives discussed    No follow-ups on file.  Nicolette Bang, MD  [facility] [place]

## 2022-03-09 NOTE — Progress Notes (Signed)
Fill and Pull Catheter Removal  Patient is present today for a catheter removal.  Patient was cleaned and prepped in a sterile fashion unable to instill sterile water/ saline into the bladder due to pt having bladder spasms. Foley catheter pulled per MD. 36m of water was then drained from the balloon.  A 22FR foley cath was removed from the bladder no complications were noted .  Patient as then given some time to void on their own.   Patient tolerated well.  Performed by: Annica Marinello LPN  Follow up/ Additional notes: Per MD note

## 2022-03-19 DIAGNOSIS — Z6841 Body Mass Index (BMI) 40.0 and over, adult: Secondary | ICD-10-CM | POA: Diagnosis not present

## 2022-03-19 DIAGNOSIS — R319 Hematuria, unspecified: Secondary | ICD-10-CM | POA: Diagnosis not present

## 2022-03-19 DIAGNOSIS — E876 Hypokalemia: Secondary | ICD-10-CM | POA: Diagnosis present

## 2022-03-19 DIAGNOSIS — R310 Gross hematuria: Secondary | ICD-10-CM | POA: Insufficient documentation

## 2022-03-19 DIAGNOSIS — C679 Malignant neoplasm of bladder, unspecified: Secondary | ICD-10-CM | POA: Diagnosis present

## 2022-03-20 DIAGNOSIS — C679 Malignant neoplasm of bladder, unspecified: Secondary | ICD-10-CM | POA: Insufficient documentation

## 2022-03-21 ENCOUNTER — Telehealth: Payer: Self-pay

## 2022-03-21 DIAGNOSIS — E876 Hypokalemia: Secondary | ICD-10-CM | POA: Insufficient documentation

## 2022-03-21 NOTE — Telephone Encounter (Signed)
Patient called several times requesting a call back for an urgent matter.  He did not give any other details to why he was calling.  Returned call with no answer.  Left voicemail for him to return our call.

## 2022-03-21 NOTE — Telephone Encounter (Signed)
Patient called to inform you that he was in [place] and he had a lot of bleeding and clots and was admitted to the ICU in [place].  They will release the records in a day or two for you to review.  He wants to know if this will delay his surgery or if this will cause any change of plans in his surgery.  Please advise.

## 2022-03-22 NOTE — Telephone Encounter (Signed)
Patient aware.

## 2022-03-24 ENCOUNTER — Encounter (HOSPITAL_COMMUNITY)
Admission: RE | Admit: 2022-03-24 | Discharge: 2022-03-24 | Disposition: A | Discharge status: Home / Self Care | Payer: 59 | Source: Ambulatory Visit | Attending: Urology | Admitting: Urology

## 2022-03-24 NOTE — Progress Notes (Signed)
Patient stated he was on vacation in [place] and was admitted to the hospital for bleeding from his bladder.  He states Dr Alyson Ingles is aware of admission, labs repeated on 03/21/2022.  OK to proceed with surgery on 03/28/2022

## 2022-03-28 ENCOUNTER — Telehealth: Payer: Self-pay

## 2022-03-28 ENCOUNTER — Encounter (HOSPITAL_COMMUNITY)
Admission: RE | Disposition: A | Discharge status: Home / Self Care | Payer: Self-pay | Source: Ambulatory Visit | Attending: Urology

## 2022-03-28 ENCOUNTER — Ambulatory Visit (HOSPITAL_COMMUNITY): Payer: 59 | Admitting: Certified Registered"

## 2022-03-28 ENCOUNTER — Ambulatory Visit (HOSPITAL_BASED_OUTPATIENT_CLINIC_OR_DEPARTMENT_OTHER): Payer: 59 | Admitting: Certified Registered"

## 2022-03-28 ENCOUNTER — Encounter (HOSPITAL_COMMUNITY): Payer: Self-pay | Admitting: Urology

## 2022-03-28 ENCOUNTER — Ambulatory Visit (HOSPITAL_COMMUNITY)
Admission: RE | Admit: 2022-03-28 | Discharge: 2022-03-28 | Disposition: A | Discharge status: Home / Self Care | Payer: 59 | Source: Ambulatory Visit | Attending: Urology | Admitting: Urology

## 2022-03-28 DIAGNOSIS — M1990 Unspecified osteoarthritis, unspecified site: Secondary | ICD-10-CM | POA: Diagnosis not present

## 2022-03-28 DIAGNOSIS — N3090 Cystitis, unspecified without hematuria: Secondary | ICD-10-CM | POA: Diagnosis not present

## 2022-03-28 DIAGNOSIS — I252 Old myocardial infarction: Secondary | ICD-10-CM | POA: Diagnosis not present

## 2022-03-28 DIAGNOSIS — G4733 Obstructive sleep apnea (adult) (pediatric): Secondary | ICD-10-CM | POA: Diagnosis not present

## 2022-03-28 DIAGNOSIS — I10 Essential (primary) hypertension: Secondary | ICD-10-CM | POA: Diagnosis not present

## 2022-03-28 DIAGNOSIS — I739 Peripheral vascular disease, unspecified: Secondary | ICD-10-CM | POA: Diagnosis not present

## 2022-03-28 DIAGNOSIS — C679 Malignant neoplasm of bladder, unspecified: Secondary | ICD-10-CM | POA: Insufficient documentation

## 2022-03-28 DIAGNOSIS — G4730 Sleep apnea, unspecified: Secondary | ICD-10-CM | POA: Insufficient documentation

## 2022-03-28 DIAGNOSIS — Z9989 Dependence on other enabling machines and devices: Secondary | ICD-10-CM

## 2022-03-28 DIAGNOSIS — D494 Neoplasm of unspecified behavior of bladder: Secondary | ICD-10-CM

## 2022-03-28 DIAGNOSIS — D090 Carcinoma in situ of bladder: Secondary | ICD-10-CM

## 2022-03-28 HISTORY — PX: CYSTOSCOPY: SHX5120

## 2022-03-28 HISTORY — PX: TRANSURETHRAL RESECTION OF BLADDER TUMOR: SHX2575

## 2022-03-28 SURGERY — CYSTOSCOPY
Anesthesia: General | Site: Bladder

## 2022-03-28 MED ORDER — ROCURONIUM BROMIDE 10 MG/ML (PF) SYRINGE
PREFILLED_SYRINGE | INTRAVENOUS | Status: AC
  Filled 2022-03-28: qty 20

## 2022-03-28 MED ORDER — OXYCODONE-ACETAMINOPHEN 5-325 MG PO TABS: 1.0000 | ORAL_TABLET | ORAL | 0 refills | Status: AC | PRN

## 2022-03-28 MED ORDER — DEXAMETHASONE SODIUM PHOSPHATE 10 MG/ML IJ SOLN
INTRAMUSCULAR | Status: DC | PRN
  Administered 2022-03-28: 8 mg via INTRAVENOUS

## 2022-03-28 MED ORDER — LIDOCAINE HCL (PF) 2 % IJ SOLN
INTRAMUSCULAR | Status: AC
  Filled 2022-03-28: qty 5

## 2022-03-28 MED ORDER — ONDANSETRON HCL 4 MG/2ML IJ SOLN: 4.0000 mg | Freq: Once | INTRAMUSCULAR | Status: DC | PRN

## 2022-03-28 MED ORDER — FENTANYL CITRATE (PF) 100 MCG/2ML IJ SOLN
INTRAMUSCULAR | Status: DC | PRN
  Administered 2022-03-28 (×3): 50 ug via INTRAVENOUS

## 2022-03-28 MED ORDER — SODIUM CHLORIDE 0.9 % IR SOLN
Status: DC | PRN
  Administered 2022-03-28: 3000 mL

## 2022-03-28 MED ORDER — OXYCODONE HCL 5 MG PO TABS
5.0000 mg | ORAL_TABLET | Freq: Once | ORAL | Status: AC | PRN
  Administered 2022-03-28: 5 mg via ORAL
  Filled 2022-03-28: qty 1

## 2022-03-28 MED ORDER — CEFAZOLIN IN SODIUM CHLORIDE 3-0.9 GM/100ML-% IV SOLN
3.0000 g | INTRAVENOUS | Status: AC
  Administered 2022-03-28: 3 g via INTRAVENOUS
  Filled 2022-03-28: qty 100

## 2022-03-28 MED ORDER — ONDANSETRON HCL 4 MG/2ML IJ SOLN
INTRAMUSCULAR | Status: DC | PRN
  Administered 2022-03-28: 4 mg via INTRAVENOUS

## 2022-03-28 MED ORDER — CHLORHEXIDINE GLUCONATE 0.12 % MT SOLN
15.0000 mL | Freq: Once | OROMUCOSAL | Status: AC
  Administered 2022-03-28: 15 mL via OROMUCOSAL

## 2022-03-28 MED ORDER — DEXAMETHASONE SODIUM PHOSPHATE 10 MG/ML IJ SOLN
INTRAMUSCULAR | Status: AC
  Filled 2022-03-28: qty 1

## 2022-03-28 MED ORDER — STERILE WATER FOR IRRIGATION IR SOLN
Status: DC | PRN
  Administered 2022-03-28: 500 mL

## 2022-03-28 MED ORDER — LIDOCAINE 2% (20 MG/ML) 5 ML SYRINGE
INTRAMUSCULAR | Status: DC | PRN
  Administered 2022-03-28: 100 mg via INTRAVENOUS

## 2022-03-28 MED ORDER — ORAL CARE MOUTH RINSE
15.0000 mL | Freq: Once | OROMUCOSAL | Status: AC
Start: 2022-03-28 — End: 2022-03-28

## 2022-03-28 MED ORDER — ROCURONIUM BROMIDE 100 MG/10ML IV SOLN
INTRAVENOUS | Status: DC | PRN
  Administered 2022-03-28: 60 mg via INTRAVENOUS

## 2022-03-28 MED ORDER — SUGAMMADEX SODIUM 200 MG/2ML IV SOLN
INTRAVENOUS | Status: DC | PRN
  Administered 2022-03-28: 250 mg via INTRAVENOUS

## 2022-03-28 MED ORDER — PROPOFOL 10 MG/ML IV BOLUS
INTRAVENOUS | Status: AC
  Filled 2022-03-28: qty 20

## 2022-03-28 MED ORDER — OXYCODONE HCL 5 MG/5ML PO SOLN: 5.0000 mg | Freq: Once | ORAL | Status: AC | PRN

## 2022-03-28 MED ORDER — DEXMEDETOMIDINE HCL IN NACL 80 MCG/20ML IV SOLN
INTRAVENOUS | Status: AC
  Filled 2022-03-28: qty 20

## 2022-03-28 MED ORDER — FENTANYL CITRATE (PF) 100 MCG/2ML IJ SOLN
INTRAMUSCULAR | Status: AC
  Filled 2022-03-28: qty 2

## 2022-03-28 MED ORDER — FENTANYL CITRATE PF 50 MCG/ML IJ SOSY
25.0000 ug | PREFILLED_SYRINGE | INTRAMUSCULAR | Status: DC | PRN
  Administered 2022-03-28: 50 ug via INTRAVENOUS
  Filled 2022-03-28: qty 1

## 2022-03-28 MED ORDER — LACTATED RINGERS IV SOLN: INTRAVENOUS | Status: DC

## 2022-03-28 MED ORDER — PROPOFOL 10 MG/ML IV BOLUS
INTRAVENOUS | Status: DC | PRN
  Administered 2022-03-28: 250 mg via INTRAVENOUS

## 2022-03-28 MED ORDER — DEXMEDETOMIDINE (PRECEDEX) IN NS 20 MCG/5ML (4 MCG/ML) IV SYRINGE
PREFILLED_SYRINGE | INTRAVENOUS | Status: DC | PRN
  Administered 2022-03-28 (×3): 4 ug via INTRAVENOUS

## 2022-03-28 SURGICAL SUPPLY — 26 items
BAG DRAIN URO TABLE W/ADPT NS (BAG) ×2 IMPLANT
BAG DRN 8 ADPR NS SKTRN CSTL (BAG) ×1
BAG DRN RND TRDRP ANRFLXCHMBR (UROLOGICAL SUPPLIES) ×1
BAG HAMPER (MISCELLANEOUS) ×2 IMPLANT
BAG URINE DRAIN 2000ML AR STRL (UROLOGICAL SUPPLIES) ×2 IMPLANT
CATH FOLEY 3WAY 30CC 22F (CATHETERS) ×1 IMPLANT
CLOTH BEACON ORANGE TIMEOUT ST (SAFETY) ×2 IMPLANT
ELECT LOOP 22F BIPOLAR SML (ELECTROSURGICAL) ×2
ELECTRODE LOOP 22F BIPOLAR SML (ELECTROSURGICAL) ×1 IMPLANT
GLOVE BIO SURGEON STRL SZ8 (GLOVE) ×2 IMPLANT
GLOVE BIOGEL PI IND STRL 7.0 (GLOVE) ×2 IMPLANT
GLOVE BIOGEL PI INDICATOR 7.0 (GLOVE) ×2
GOWN STRL REUS W/TWL LRG LVL3 (GOWN DISPOSABLE) ×4 IMPLANT
GOWN STRL REUS W/TWL XL LVL3 (GOWN DISPOSABLE) ×2 IMPLANT
IV NS IRRIG 3000ML ARTHROMATIC (IV SOLUTION) ×4 IMPLANT
KIT TURNOVER CYSTO (KITS) ×2 IMPLANT
MANIFOLD NEPTUNE II (INSTRUMENTS) ×2 IMPLANT
PACK CYSTO (CUSTOM PROCEDURE TRAY) ×2 IMPLANT
PAD ARMBOARD 7.5X6 YLW CONV (MISCELLANEOUS) ×2 IMPLANT
PLUG CATH AND CAP STER (CATHETERS) ×1 IMPLANT
SYR 30ML LL (SYRINGE) ×1 IMPLANT
SYR TOOMEY IRRIG 70ML (MISCELLANEOUS) ×2
SYRINGE TOOMEY IRRIG 70ML (MISCELLANEOUS) IMPLANT
TOWEL NATURAL 4PK STERILE (DISPOSABLE) ×2 IMPLANT
TOWEL OR 17X26 4PK STRL BLUE (TOWEL DISPOSABLE) ×2 IMPLANT
WATER STERILE IRR 500ML POUR (IV SOLUTION) ×2 IMPLANT

## 2022-03-28 NOTE — Op Note (Signed)
.  Preoperative diagnosis: bladder tumor  Postoperative diagnosis: Same  Procedure: 1 cystoscopy 2.Transurethral resection of bladder tumor, medium  Attending: Rosie Fate  Anesthesia: General  Estimated blood loss: Minimal  Drains: 22 French foley  Specimens: bladder tumor  Antibiotics: ancef  Findings: 3cm area resected at previous resection site. Ureteral orifices in normal anatomic location.   Indications: Patient is a 71 year old male with a history of high grade bladder cancer here for repeat resection.  After discussing treatment options, they decided proceed with transurethral resection of a bladder tumor.  Procedure in detail: The patient was brought to the operating room and a brief timeout was done to ensure correct patient, correct procedure, correct site.  General anesthesia was administered patient was placed in dorsal lithotomy position.  Their genitalia was then prepped and draped in usual sterile fashion.  A rigid 56 French cystoscope was passed in the urethra and the bladder.  Bladder was inspected and we noted a 3cm bladder tumor.  the ureteral orifices were in the normal orthotopic locations. We then removed the cystoscope and placed a resectoscope into the bladder.  Using the bipolar resectoscope we removed the bladder tumor down to the base. A subsequent muscle deep biopsy was then taken. Hemostasis was then obtained with electrocautery. We then removed the bladder tumor chips and sent them for pathology. We then re-inspected the bladder and found no residula bleeding.  the bladder was then drained, a 22 French foley was placed and this concluded the procedure which was well tolerated by patient.  Complications: None  Condition: Stable, extubated, transferred to PACU  Plan: Patient is to be discharged home and followup in 5 days for foley catheter removal and pathology discussion.

## 2022-03-28 NOTE — Telephone Encounter (Signed)
Patient surgery date was moved up to today, he called to schedule and apt to have catheter removed.  Scheduled him for voiding trial on 08/07 and kept f/u already scheduled with Dr. Alyson Ingles on 08/15.  Reminder letter sent by mail.

## 2022-03-28 NOTE — Transfer of Care (Signed)
Immediate Anesthesia Transfer of Care Note  Patient: Rodney Moreno  Procedure(s) Performed: CYSTOSCOPY (Bladder) TRANSURETHRAL RESECTION OF BLADDER TUMOR (TURBT) (Bladder)  Patient Location: PACU  Anesthesia Type:General  Level of Consciousness: awake, alert  and oriented  Airway & Oxygen Therapy: Patient Spontanous Breathing  Post-op Assessment: Report given to RN and Post -op Vital signs reviewed and stable  Post vital signs: Reviewed and stable  Last Vitals:  Vitals Value Taken Time  BP 140/96 03/28/22 1102  Temp    Pulse 79 03/28/22 1107  Resp 8 03/28/22 1107  SpO2 88 % 03/28/22 1107  Vitals shown include unvalidated device data.  Last Pain:  Vitals:   03/28/22 0822  TempSrc: (P) Oral  PainSc: (P) 0-No pain      Patients Stated Pain Goal: (P) 5 (50/38/88 2800)  Complications: No notable events documented.

## 2022-03-28 NOTE — Anesthesia Preprocedure Evaluation (Signed)
Anesthesia Evaluation  Patient identified by MRN, date of birth, ID band Patient awake    Reviewed: Allergy & Precautions, NPO status , Patient's Chart, lab work & pertinent test results  History of Anesthesia Complications Negative for: history of anesthetic complications  Airway Mallampati: III  TM Distance: >3 FB Neck ROM: Full   Comment: Neck sx Dental  (+) Dental Advisory Given, Missing   Pulmonary sleep apnea and Continuous Positive Airway Pressure Ventilation ,    Pulmonary exam normal breath sounds clear to auscultation       Cardiovascular Exercise Tolerance: Good hypertension, Pt. 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)  Normal cardiovascular exam Rhythm:Regular Rate:Normal     Neuro/Psych negative neurological ROS  negative psych ROS   GI/Hepatic negative GI ROS, Neg liver ROS,   Endo/Other  negative endocrine ROS  Renal/GU negative Renal ROS  negative genitourinary   Musculoskeletal  (+) Arthritis  (neck sx),   Abdominal   Peds negative pediatric ROS (+)  Hematology negative hematology ROS (+)   Anesthesia Other Findings   Reproductive/Obstetrics negative OB ROS                             Anesthesia Physical  Anesthesia Plan  ASA: 3  Anesthesia Plan: General and General LMA   Post-op Pain Management: Dilaudid IV   Induction: Intravenous  PONV Risk Score and Plan: 4 or greater and Ondansetron  Airway Management Planned: Oral ETT and LMA  Additional Equipment:   Intra-op Plan:   Post-operative Plan: Extubation in OR  Informed Consent: I have reviewed the patients History and Physical, chart, labs and discussed the procedure including the risks, benefits and alternatives for the proposed anesthesia with the patient or authorized representative who has indicated his/her understanding and acceptance.     Dental advisory  given  Plan Discussed with: CRNA and Surgeon  Anesthesia Plan Comments:         Anesthesia Quick Evaluation

## 2022-03-28 NOTE — Anesthesia Procedure Notes (Signed)
Procedure Name: Intubation Date/Time: 03/28/2022 10:08 AM  Performed by: Gwyndolyn Saxon, CRNAPre-anesthesia Checklist: Patient identified, Emergency Drugs available, Suction available and Patient being monitored Patient Re-evaluated:Patient Re-evaluated prior to induction Oxygen Delivery Method: Circle system utilized Preoxygenation: Pre-oxygenation with 100% oxygen Induction Type: IV induction Ventilation: Mask ventilation without difficulty and Oral airway inserted - appropriate to patient size Laryngoscope Size: Sabra Heck and 2 Grade View: Grade II Tube type: Oral Tube size: 8.0 mm Number of attempts: 1 Airway Equipment and Method: Patient positioned with wedge pillow and Stylet Placement Confirmation: ETT inserted through vocal cords under direct vision, positive ETCO2 and breath sounds checked- equal and bilateral Secured at: 23 cm Tube secured with: Tape Dental Injury: Teeth and Oropharynx as per pre-operative assessment

## 2022-03-28 NOTE — Interval H&P Note (Signed)
History and Physical Interval Note:  03/28/2022 9:38 AM  Rodney Moreno  has presented today for surgery, with the diagnosis of bladder cancer.  The various methods of treatment have been discussed with the patient and family. After consideration of risks, benefits and other options for treatment, the patient has consented to  Procedure(s): CYSTOSCOPY (N/A) TRANSURETHRAL RESECTION OF BLADDER TUMOR (TURBT) (N/A) as a surgical intervention.  The patient's history has been reviewed, patient examined, no change in status, stable for surgery.  I have reviewed the patient's chart and labs.  Questions were answered to the patient's satisfaction.     Nicolette Bang

## 2022-03-29 ENCOUNTER — Encounter (HOSPITAL_COMMUNITY): Payer: Self-pay | Admitting: Urology

## 2022-03-29 LAB — SURGICAL PATHOLOGY

## 2022-03-29 NOTE — Anesthesia Postprocedure Evaluation (Signed)
Anesthesia Post Note  Patient: Rodney Moreno  Procedure(s) Performed: CYSTOSCOPY (Bladder) TRANSURETHRAL RESECTION OF BLADDER TUMOR (TURBT) (Bladder)  Patient location during evaluation: Phase II Anesthesia Type: General Level of consciousness: awake Pain management: pain level controlled Vital Signs Assessment: post-procedure vital signs reviewed and stable Respiratory status: spontaneous breathing and respiratory function stable Cardiovascular status: blood pressure returned to baseline and stable Postop Assessment: no headache and no apparent nausea or vomiting Anesthetic complications: no Comments: Late entry   No notable events documented.   Last Vitals:  Vitals:   03/28/22 1145 03/28/22 1201  BP: 121/86   Pulse: 73 82  Resp: 14 18  Temp:  36.9 C  SpO2: 94%     Last Pain:  Vitals:   03/28/22 1201  TempSrc: Oral  PainSc: [place]

## 2022-04-04 ENCOUNTER — Telehealth: Payer: Self-pay

## 2022-04-04 ENCOUNTER — Ambulatory Visit (INDEPENDENT_AMBULATORY_CARE_PROVIDER_SITE_OTHER): Payer: 59 | Admitting: Urology

## 2022-04-04 DIAGNOSIS — D494 Neoplasm of unspecified behavior of bladder: Secondary | ICD-10-CM

## 2022-04-04 NOTE — Progress Notes (Signed)
Fill and Pull Catheter Removal  Patient is present today for a catheter removal.  Patient was cleaned and prepped in a sterile fashion 65m of sterile water/ saline was instilled into the bladder when the patient felt the urge to urinate. 117mof water was then drained from the balloon.  A 22FR foley cath was removed from the bladder no complications were noted .  Patient as then given some time to void on their own.  Patient can void  5070mn their own after some time.  Patient tolerated well.  Performed by: Hope LPN  Follow up/ Additional notes: keep scheduled OV

## 2022-04-04 NOTE — Telephone Encounter (Signed)
Awesome! Thanks

## 2022-04-04 NOTE — Telephone Encounter (Signed)
Patient called advising he went home and consumed a large bottle of green tea and he has voided with no issue.

## 2022-04-04 NOTE — Progress Notes (Signed)
See above

## 2022-04-12 ENCOUNTER — Ambulatory Visit (INDEPENDENT_AMBULATORY_CARE_PROVIDER_SITE_OTHER): Payer: 59 | Admitting: Urology

## 2022-04-12 ENCOUNTER — Encounter: Payer: Self-pay | Admitting: Urology

## 2022-04-12 VITALS — BP 125/74 | HR 94

## 2022-04-12 DIAGNOSIS — D090 Carcinoma in situ of bladder: Secondary | ICD-10-CM | POA: Diagnosis not present

## 2022-04-12 DIAGNOSIS — D494 Neoplasm of unspecified behavior of bladder: Secondary | ICD-10-CM

## 2022-04-12 DIAGNOSIS — N3000 Acute cystitis without hematuria: Secondary | ICD-10-CM

## 2022-04-12 NOTE — Progress Notes (Signed)
04/12/2022 10:57 AM   Rodney Moreno 1951-05-10 [id number]  Referring provider: Redmond School, MD [address] [place],  [place] [address]  Followup bladder cancer   HPI: Rodney Moreno is a 71yo here for followup for bladder cancer. 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 No significant LUTS. No hematuria   PMH: Past Medical History:  Diagnosis Date   Hypertension    Myocardial infarction [facility])    Sleep apnea     Surgical History: Past Surgical History:  Procedure Laterality Date   BACK SURGERY  2003   BLADDER INSTILLATION N/A 03/03/2022   Procedure: BLADDER INSTILLATION-GEMCITABINE;  Surgeon: Cleon Gustin, MD;  Location: [facility];  Service: Urology;  Laterality: N/A;   CYSTOSCOPY N/A 03/28/2022   Procedure: CYSTOSCOPY;  Surgeon: Cleon Gustin, MD;  Location: [facility];  Service: Urology;  Laterality: N/A;   CYSTOSCOPY W/ RETROGRADES Bilateral 03/03/2022   Procedure: CYSTOSCOPY WITH RETROGRADE PYELOGRAM;  Surgeon: Cleon Gustin, MD;  Location: [facility];  Service: Urology;  Laterality: Bilateral;   MOUTH SURGERY     TRANSURETHRAL RESECTION OF BLADDER TUMOR N/A 03/03/2022   Procedure: TRANSURETHRAL RESECTION OF BLADDER TUMOR (TURBT);  Surgeon: Cleon Gustin, MD;  Location: [facility];  Service: Urology;  Laterality: N/A;   TRANSURETHRAL RESECTION OF BLADDER TUMOR N/A 03/28/2022   Procedure: TRANSURETHRAL RESECTION OF BLADDER TUMOR (TURBT);  Surgeon: Cleon Gustin, MD;  Location: [facility];  Service: Urology;  Laterality: N/A;    Home Medications:  Allergies as of 04/12/2022   No Known Allergies      Medication List        Accurate as of April 12, 2022 10:57 AM. If you have any questions, ask your nurse or doctor.          allopurinol 300 MG tablet Commonly known as: ZYLOPRIM Take 300 mg by mouth daily.   aspirin EC 81 MG tablet Take 81 mg by mouth daily. Swallow whole.   enalapril-hydrochlorothiazide 10-25 MG tablet Commonly known as:  VASERETIC Take 1 tablet by mouth daily.   ezetimibe 10 MG tablet Commonly known as: ZETIA Take 10 mg by mouth daily.   oxyCODONE-acetaminophen 5-325 MG tablet Commonly known as: Percocet Take 1 tablet by mouth every 4 (four) hours as needed for severe pain.        Allergies: No Known Allergies  Family History: Family History  Problem Relation Age of Onset   Cancer Father     Social History:  reports that he has never smoked. He has never used smokeless tobacco. He reports that he does not drink alcohol and does not use drugs.  ROS: All other review of systems were reviewed and are negative except what is noted above in HPI  Physical Exam: BP 125/74   Pulse 94   Constitutional:  Alert and oriented, No acute distress. HEENT: [place] AT, moist mucus membranes.  Trachea midline, no masses. Cardiovascular: No clubbing, cyanosis, or edema. Respiratory: Normal respiratory effort, no increased work of breathing. GI: Abdomen is soft, nontender, nondistended, no abdominal masses GU: No CVA tenderness.  Lymph: No cervical or inguinal lymphadenopathy. Skin: No rashes, bruises or suspicious lesions. Neurologic: Grossly intact, no focal deficits, moving all 4 extremities. Psychiatric: Normal mood and affect.  Laboratory Data: Lab Results  Component Value Date   WBC 7.3 06/23/2008   HGB 14.5 06/23/2008   HCT 42.3 06/23/2008   MCV 92.4 06/23/2008   PLT 232 06/23/2008    Lab Results  Component Value Date  CREATININE 0.87 03/02/2022    No results found for: "PSA"  No results found for: "TESTOSTERONE"  No results found for: "HGBA1C"  Urinalysis    Component Value Date/Time   COLORURINE YELLOW 06/22/2008 1924   APPEARANCEUR Clear 02/16/2022 1452   LABSPEC >1.030 (H) 06/22/2008 1924   PHURINE 6.0 06/22/2008 1924   GLUCOSEU Negative 02/16/2022 1452   HGBUR NEGATIVE 06/22/2008 1924   BILIRUBINUR Negative 02/16/2022 1452   [place] 06/22/2008 1924   PROTEINUR  Trace (A) 02/16/2022 1452   PROTEINUR NEGATIVE 06/22/2008 1924   UROBILINOGEN 0.2 06/22/2008 1924   NITRITE Negative 02/16/2022 1452   NITRITE NEGATIVE 06/22/2008 1924   LEUKOCYTESUR Negative 02/16/2022 1452    Lab Results  Component Value Date   LABMICR Comment 02/16/2022    Pertinent Imaging:  No results found for this or any previous visit.  No results found for this or any previous visit.  No results found for this or any previous visit.  No results found for this or any previous visit.  No results found for this or any previous visit.  No results found for this or any previous visit.  No results found for this or any previous visit.  No results found for this or any previous visit.   Assessment & Plan:    1. High grade bladder cancer -We will proceed with induction BCG therapy in 4 weeks  - Urinalysis, Routine w reflex microscopic   No follow-ups on file.  Nicolette Bang, MD  [facility] [place]

## 2022-04-12 NOTE — Patient Instructions (Addendum)
Bladder Cancer  Bladder cancer is a condition where abnormal tissue (a tumor) grows in the bladder. The bladder is the organ that holds urine. Two tubes (ureters) carry urine from the kidneys to the bladder. The bladder wall is made of layers of tissue. Cancer that spreads through these layers of the bladder wall becomes more difficult to treat. What increases the risk? The following factors may make you more likely to develop this condition: Smoking. Working where there are risks (occupational exposures), such as working with rubber, leather, clothing fabric, dyes, chemicals, or paint. Being 55 years of age or older. Being male. Having long-term bladder inflammation. Having a history of cancer. This includes: A family history of bladder cancer. Having had bladder cancer before. Having had certain treatments for cancer before, such as: Medicines to kill cancer cells (chemotherapy). Strong X-ray beams or high-energy capsules to kill cancer cells and shrink tumors (radiation therapy). Having been exposed to arsenic. This is a poisonous substance. What are the signs or symptoms? Early symptoms of this condition include: Blood in your urine. Pain when urinating. Infections of your urinary system (urinary tract infections or UTIs) that happen often. Having to urinate sooner or more often than normal. Late symptoms of this condition include: Not being able to urinate. Pain on one side of your lower back. Loss of appetite. Weight loss. Tiredness (fatigue). Swelling in your feet. Bone pain. How is this diagnosed? This condition is diagnosed based on: Your medical history. A physical exam. Lab tests, such as urine tests. Imaging tests. Your symptoms. You may also have other tests or procedures, such as: A cystoscopy. This involves putting a narrow tube into your urethra. The urethra is the organ that carries urine from your bladder to the outside of your body. This procedure is done to  view the lining of your bladder for tumors. A biopsy. This involves removing a tissue sample to look at under a microscope to check for cancer. Blood tests or imaging tests may be needed. These show how far into the bladder wall cancer has grown, and if cancer has spread to any other parts of your body. Tests may include: CT scan. MRI. Bone scan. X-ray. How is this treated? Your health care provider may recommend one or more types of treatment based on the stage of your cancer. The most common treatments are: Surgery to remove the cancer. Types of surgeries include: Removing a tumor on the inside wall of the bladder (transurethral resection). Removing the bladder (cystectomy). Radiation therapy. This is often combined with chemotherapy. Chemotherapy. Immunotherapy. This uses medicines to help your body's disease-fighting system (immune system) destroy cancer cells. Follow these instructions at home: Take over-the-counter and prescription medicines only as told by your health care provider. If you were prescribed an antibiotic medicine, take it as told by your health care provider. Do not stop using the antibiotic even if you start to feel better. Eat a healthy diet. Some treatments might affect your appetite. Do not use any products that contain nicotine or tobacco. These products include cigarettes, chewing tobacco, and vaping devices, such as e-cigarettes. If you need help quitting, ask your health care provider. Consider joining a support group. This may help you learn to deal with the stress of having bladder cancer. Tell your cancer care team if you develop side effects. Your team may be able to recommend ways to get relief. Keep all follow-up visits. This is important. Where to find more information American Cancer Society (ACS): cancer.org National   [place] ([place]): cancer.gov Contact a health care provider if: You have symptoms of a UTI. These  include: Fever. Chills. Weakness. Muscle aches. Pain in your abdomen. Urge to urinate that is stronger and happens more often than normal. Burning in the bladder or urethra when you urinate. Get help right away if: There is blood in your urine. You cannot urinate. You have severe pain or other symptoms that do not go away. Summary Bladder cancer is a condition where tumors grow in the bladder. Diagnosis is based on your medical history, a physical exam, lab tests, imaging tests, and your symptoms. Your health care provider may recommend one or more types of treatment based on the stage of your cancer. Consider joining a support group. This may help you learn to deal with the stress of having bladder cancer. This information is not intended to replace advice given to you by your health care provider. Make sure you discuss any questions you have with your health care provider. Document Revised: 07/26/2021 Document Reviewed: 07/26/2021 Elsevier Patient Education  [place].    Patient Education: (BCG) Into the Bladder (Intravesical Chemotherapy)  BCG is a vaccine which is used to prevent tuberculosis (TB).  But it's also a helpful treatment for some early bladder cancers.  When BCG goes directly into the bladder the treatment is described as intravesical.  BCG is a type of immunotherapy.  Immunotherapy stimulates the body's immune system to destroy cancer cells.  How it's given BCG treatment is given to you in an outpatient setting.  It takes a few minutes to administer and you can go home as soon as it's finished.  It might be a good idea to ask someone to bring you, particularly the fist time.  Unlike chemotherapy into the bladder, BCG treatment is never given immediately after surgery to remove bladder tumors.  There needs to be a delay usually of at least two weeks after surgery, before you can have it.  You won't be given treatment with BCG if you are unwell or have an  infection in your urine.  You're usually asked to limit the amount you drink before your treatment.  This will help to increase the concentration of BCG in your bladder.  Drinking too much before your treatment may make your bladder feel uncomfortably full.  If you normally take water tablets (diuretics) take them later in the day after your treatment.  Your nurse or doctor will give you more advise about preparing for your treatment.  You will have a small tube (catheter) placed into your bladder.  Your doctor will then put the liquid vaccine directly into your bladder through the catheter and remove the catheter.  You will need to hold your urine for two hours afterwards.  This can be difficult but it's to give the treatment time to work.  You can walk around during this time.  When the treatment is over you can go to the toilet.  After your treatment there are some precautions you'll need to take.  This is because BCG is a live vaccine and other people shouldn't be exposed to it.  For the next six hours, you'll need to avoid your urine splashing on the toilet seat and getting any urine on your hands.  It might be easer for men to sit down when they're using an ordinary toilet although using a stand up urinal should be alright.  The main this is to avoid splashing urine and spreading the vaccine.  You will also be asked to put 1/2 cup undiluted bleach into the toilet to destroy any live vaccine and leave it for 15 minutes until you flush.  Side Effects Because BCG goes directly into the bladder most of the side effects are linked with the bladder.  They usually go away within one to two days after your treatment.  The most common ones are: -needing to pass urine often -pain when you pass urine -blood in urine -flu-like symptoms (tiredness, general aching and raised temperature)  Theses side effects should settle down within a day or two.  If they don't get better contact your doctor.  Drinking  lots of fluids can help flush the drug out of your bladder and reduce some of these effects.  Taking Ibuprofen or Aleve is encouraged unless you have a condition that would make these medications unsafe to take (renal failure, diabetes, gerd)  Rare side effects can include a continuing high temperature (fever), pain in your joints and a cough.  If you have any of these symptoms, or if you feel generally unwell, contact your doctor.  These symptoms could be a sign of a more serious infection (due to BCG) that needs to be treated immediately.  If this happens you'll be treated with the same drugs (antibiotics) that are used to treat TB.  Contraception Men should use a condom during sex for the first 48 hours after their treatment.  If you are a women who has had BCG treatment then your partner should use a condom.  Using a condom will protect your partner from any vaccine present in your semen or vaginal fluid.  We don't know how BCG may affect a developing fetus so it's not advisable to become pregnant or father a child while having it.  It is important to use effective contraception during your treatment and for six weeks afterwards.  You can discuss this with your doctor or specialist nurse.

## 2022-04-12 NOTE — Addendum Note (Signed)
Addended by: Cleon Gustin on: 04/12/2022 11:25 AM   Modules accepted: Orders

## 2022-04-14 LAB — URINALYSIS, ROUTINE W REFLEX MICROSCOPIC
Bilirubin, UA: NEGATIVE
Glucose, UA: NEGATIVE
Ketones, UA: NEGATIVE
Nitrite, UA: POSITIVE — AB
Specific Gravity, UA: 1.025 (ref 1.005–1.030)
Urobilinogen, Ur: 0.2 mg/dL (ref 0.2–1.0)
pH, UA: 5 (ref 5.0–7.5)

## 2022-04-14 LAB — MICROSCOPIC EXAMINATION: WBC, UA: >30 /hpf — AB (ref 0–5)

## 2022-04-15 LAB — URINE CULTURE

## 2022-04-20 ENCOUNTER — Telehealth: Payer: Self-pay

## 2022-04-20 ENCOUNTER — Other Ambulatory Visit: Payer: Self-pay | Admitting: Urology

## 2022-04-20 MED ORDER — SULFAMETHOXAZOLE-TRIMETHOPRIM 800-160 MG PO TABS: 1.0000 | ORAL_TABLET | Freq: Two times a day (BID) | ORAL | 0 refills | Status: DC

## 2022-04-20 NOTE — Telephone Encounter (Signed)
-----   Message from Reynaldo Minium, [place] sent at 04/20/2022  3:47 PM EDT ----- Please let pt know his urine dx is positive and he needs antibx.  I sent in a prescription for Bactrim DS for him. ----- Message ----- From: Audie Box, CMA Sent: 04/18/2022   8:42 AM EDT To: Cleon Gustin, MD; #  Please review, no current antibiotics

## 2022-04-20 NOTE — Telephone Encounter (Signed)
Patient called and made aware.

## 2022-04-27 ENCOUNTER — Other Ambulatory Visit (HOSPITAL_COMMUNITY): Payer: Self-pay | Admitting: Internal Medicine

## 2022-04-27 DIAGNOSIS — I712 Thoracic aortic aneurysm, without rupture, unspecified: Secondary | ICD-10-CM

## 2022-05-04 ENCOUNTER — Other Ambulatory Visit: Payer: 59

## 2022-05-04 DIAGNOSIS — R300 Dysuria: Secondary | ICD-10-CM

## 2022-05-04 NOTE — Progress Notes (Unsigned)
Mdx culture sent.  Confirmation [id number]

## 2022-05-10 ENCOUNTER — Telehealth: Payer: Self-pay

## 2022-05-10 ENCOUNTER — Ambulatory Visit: Payer: 59 | Admitting: Physician Assistant

## 2022-05-10 MED ORDER — SULFAMETHOXAZOLE-TRIMETHOPRIM 800-160 MG PO TABS: 1.0000 | ORAL_TABLET | Freq: Two times a day (BID) | ORAL | 0 refills | Status: DC

## 2022-05-10 NOTE — Telephone Encounter (Signed)
Patient called and made aware of  MDX culture.  Bactrim DS sent in per Dr. Alyson Ingles. Patient states he will pick up rx today  BCG 1 of 3 rescheduled for Friday.

## 2022-05-13 ENCOUNTER — Ambulatory Visit (INDEPENDENT_AMBULATORY_CARE_PROVIDER_SITE_OTHER): Payer: 59 | Admitting: Urology

## 2022-05-13 VITALS — BP 142/84 | HR 74

## 2022-05-13 DIAGNOSIS — C679 Malignant neoplasm of bladder, unspecified: Secondary | ICD-10-CM | POA: Diagnosis not present

## 2022-05-13 DIAGNOSIS — D494 Neoplasm of unspecified behavior of bladder: Secondary | ICD-10-CM

## 2022-05-13 LAB — URINALYSIS, ROUTINE W REFLEX MICROSCOPIC
Bilirubin, UA: NEGATIVE
Glucose, UA: NEGATIVE
Ketones, UA: NEGATIVE
Nitrite, UA: NEGATIVE
Specific Gravity, UA: 1.025 (ref 1.005–1.030)
Urobilinogen, Ur: 1 mg/dL (ref 0.2–1.0)
pH, UA: 6 (ref 5.0–7.5)

## 2022-05-13 LAB — MICROSCOPIC EXAMINATION
RBC, Urine: >30 /HPF — AB (ref 0–2)
Renal Epithel, UA: NONE SEEN /HPF

## 2022-05-13 MED ORDER — BCG LIVE 50 MG IS SUSR
3.2400 mL | Freq: Once | INTRAVESICAL | Status: AC
  Administered 2022-05-13: 81 mg via INTRAVESICAL

## 2022-05-13 NOTE — Progress Notes (Unsigned)
BCG Bladder Instillation  BCG # 1 of 6  Due to Bladder Cancer patient is present today for a BCG treatment. Patient was cleaned and prepped in a sterile fashion with betadine. A 14FR catheter was inserted, urine return was noted 80m, urine was yellow in color.  524mof reconstituted BCG was instilled into the bladder. The catheter was then removed. Patient tolerated well, no complications were noted  Performed by: KoLevi AlandCMA  Follow up/ Additional notes: Follow up in 1 week for next treatment

## 2022-05-13 NOTE — Patient Instructions (Signed)

## 2022-05-18 ENCOUNTER — Ambulatory Visit (INDEPENDENT_AMBULATORY_CARE_PROVIDER_SITE_OTHER): Payer: 59 | Admitting: Physician Assistant

## 2022-05-18 DIAGNOSIS — C679 Malignant neoplasm of bladder, unspecified: Secondary | ICD-10-CM

## 2022-05-18 DIAGNOSIS — D494 Neoplasm of unspecified behavior of bladder: Secondary | ICD-10-CM

## 2022-05-18 MED ORDER — BCG LIVE 50 MG IS SUSR
3.2400 mL | Freq: Once | INTRAVESICAL | Status: AC
  Administered 2022-05-18: 81 mg via INTRAVESICAL

## 2022-05-18 NOTE — Progress Notes (Signed)
BCG Bladder Instillation  BCG # 2 of 6  Due to Bladder Cancer patient is present today for a BCG treatment. Patient was cleaned and prepped in a sterile fashion with betadine. A 14FR catheter was inserted, urine return was noted 39m, urine was yellow in color.  512mof reconstituted BCG was instilled into the bladder. The catheter was then removed. Patient tolerated well, no complications were noted  Performed by: KoLevi AlandCMA  Follow up/ Additional notes: Follow up as scheduled for next treatment.  Urine sent for culture per Dr. McAlyson Ingles

## 2022-05-20 ENCOUNTER — Other Ambulatory Visit: Payer: Self-pay | Admitting: Home Modifications

## 2022-05-20 DIAGNOSIS — E042 Nontoxic multinodular goiter: Secondary | ICD-10-CM

## 2022-05-20 LAB — MICROSCOPIC EXAMINATION
Bacteria, UA: NONE SEEN
Bacteria, UA: NONE SEEN
Renal Epithel, UA: NONE SEEN /hpf

## 2022-05-20 LAB — URINALYSIS, ROUTINE W REFLEX MICROSCOPIC
Bilirubin, UA: NEGATIVE
Bilirubin, UA: NEGATIVE
Glucose, UA: NEGATIVE
Glucose, UA: NEGATIVE
Ketones, UA: NEGATIVE
Nitrite, UA: NEGATIVE
Nitrite, UA: NEGATIVE
Specific Gravity, UA: 1.02 (ref 1.005–1.030)
Specific Gravity, UA: 1.025 (ref 1.005–1.030)
Urobilinogen, Ur: 0.2 mg/dL (ref 0.2–1.0)
Urobilinogen, Ur: 0.2 mg/dL (ref 0.2–1.0)
pH, UA: 6.5 (ref 5.0–7.5)
pH, UA: 5.5 (ref 5.0–7.5)

## 2022-05-21 LAB — URINE CULTURE

## 2022-05-23 ENCOUNTER — Ambulatory Visit
Admission: RE | Admit: 2022-05-23 | Discharge: 2022-05-23 | Disposition: A | Discharge status: Home / Self Care | Payer: 59 | Source: Ambulatory Visit | Attending: Home Modifications | Admitting: Home Modifications

## 2022-05-23 DIAGNOSIS — E042 Nontoxic multinodular goiter: Secondary | ICD-10-CM

## 2022-05-24 ENCOUNTER — Ambulatory Visit (INDEPENDENT_AMBULATORY_CARE_PROVIDER_SITE_OTHER): Payer: 59 | Admitting: Physician Assistant

## 2022-05-24 DIAGNOSIS — D494 Neoplasm of unspecified behavior of bladder: Secondary | ICD-10-CM

## 2022-05-24 DIAGNOSIS — C679 Malignant neoplasm of bladder, unspecified: Secondary | ICD-10-CM

## 2022-05-24 MED ORDER — BCG LIVE 50 MG IS SUSR
3.2400 mL | Freq: Once | INTRAVESICAL | Status: AC
  Administered 2022-05-24: 81 mg via INTRAVESICAL

## 2022-05-24 NOTE — Progress Notes (Signed)
BCG Bladder Instillation  BCG # 3 of 6  Due to Bladder Cancer patient is present today for a BCG treatment. Patient was cleaned and prepped in a sterile fashion with betadine. A 14 FR catheter was inserted, urine return was noted 10 ml, urine was yellow in color.  2m of reconstituted BCG was instilled into the bladder. The catheter was then removed. Patient tolerated well, no complications were noted  Performed by: SMarisue Brooklyn CMA  Follow up/ Additional notes: Follow up as scheduled

## 2022-05-25 LAB — MICROSCOPIC EXAMINATION: Bacteria, UA: NONE SEEN

## 2022-05-25 LAB — URINALYSIS, ROUTINE W REFLEX MICROSCOPIC
Bilirubin, UA: NEGATIVE
Glucose, UA: NEGATIVE
Ketones, UA: NEGATIVE
Nitrite, UA: NEGATIVE
Specific Gravity, UA: 1.02 (ref 1.005–1.030)
Urobilinogen, Ur: 0.2 mg/dL (ref 0.2–1.0)
pH, UA: 5 (ref 5.0–7.5)

## 2022-05-27 ENCOUNTER — Other Ambulatory Visit: Payer: Self-pay | Admitting: Home Modifications

## 2022-05-27 DIAGNOSIS — E042 Nontoxic multinodular goiter: Secondary | ICD-10-CM

## 2022-05-31 ENCOUNTER — Ambulatory Visit (HOSPITAL_COMMUNITY)
Admission: RE | Admit: 2022-05-31 | Discharge: 2022-05-31 | Disposition: A | Discharge status: Home / Self Care | Payer: 59 | Source: Ambulatory Visit | Attending: Internal Medicine | Admitting: Internal Medicine

## 2022-05-31 DIAGNOSIS — I712 Thoracic aortic aneurysm, without rupture, unspecified: Secondary | ICD-10-CM | POA: Diagnosis not present

## 2022-05-31 MED ORDER — IOHEXOL 350 MG/ML SOLN
100.0000 mL | Freq: Once | INTRAVENOUS | Status: AC | PRN
  Administered 2022-05-31: 100 mL via INTRAVENOUS

## 2022-06-01 ENCOUNTER — Ambulatory Visit (INDEPENDENT_AMBULATORY_CARE_PROVIDER_SITE_OTHER): Payer: 59 | Admitting: Physician Assistant

## 2022-06-01 DIAGNOSIS — C679 Malignant neoplasm of bladder, unspecified: Secondary | ICD-10-CM

## 2022-06-01 DIAGNOSIS — D494 Neoplasm of unspecified behavior of bladder: Secondary | ICD-10-CM

## 2022-06-01 LAB — POCT I-STAT CREATININE: Creatinine, Ser: 1.1 mg/dL (ref 0.61–1.24)

## 2022-06-01 LAB — URINALYSIS, ROUTINE W REFLEX MICROSCOPIC
Bilirubin, UA: NEGATIVE
Glucose, UA: NEGATIVE
Ketones, UA: NEGATIVE
Nitrite, UA: NEGATIVE
Specific Gravity, UA: 1.02 (ref 1.005–1.030)
Urobilinogen, Ur: 0.2 mg/dL (ref 0.2–1.0)
pH, UA: 5.5 (ref 5.0–7.5)

## 2022-06-01 MED ORDER — BCG LIVE 50 MG IS SUSR
3.2400 mL | Freq: Once | INTRAVESICAL | Status: AC
  Administered 2022-06-01: 81 mg via INTRAVESICAL

## 2022-06-01 NOTE — Progress Notes (Unsigned)
BCG Bladder Instillation  BCG # 4 of 6  Due to Bladder Cancer patient is present today for a BCG treatment. Patient was cleaned and prepped in a sterile fashion with betadine. A 14FR catheter was inserted, urine return was noted 1m, urine was yellow in color.  571mof reconstituted BCG was instilled into the bladder. The catheter was then removed. Patient tolerated well, no complications were noted  Performed by: KoLevi AlandCMA  Follow up/ Additional notes: F/uU as scheduled.    Procedure reviewed. Agree with clinical documentation.  Julienne A Summerlin PA-C

## 2022-06-02 ENCOUNTER — Ambulatory Visit
Admission: RE | Admit: 2022-06-02 | Discharge: 2022-06-02 | Disposition: A | Discharge status: Home / Self Care | Payer: 59 | Source: Ambulatory Visit | Attending: Home Modifications | Admitting: Home Modifications

## 2022-06-02 ENCOUNTER — Other Ambulatory Visit (HOSPITAL_COMMUNITY)
Admission: RE | Admit: 2022-06-02 | Discharge: 2022-06-02 | Disposition: A | Discharge status: Home / Self Care | Payer: 59 | Source: Ambulatory Visit | Attending: Interventional Radiology | Admitting: Interventional Radiology

## 2022-06-02 DIAGNOSIS — E042 Nontoxic multinodular goiter: Secondary | ICD-10-CM

## 2022-06-02 DIAGNOSIS — E041 Nontoxic single thyroid nodule: Secondary | ICD-10-CM | POA: Diagnosis not present

## 2022-06-06 LAB — CYTOLOGY - NON PAP

## 2022-06-07 ENCOUNTER — Ambulatory Visit (INDEPENDENT_AMBULATORY_CARE_PROVIDER_SITE_OTHER): Payer: 59 | Admitting: Physician Assistant

## 2022-06-07 ENCOUNTER — Ambulatory Visit: Payer: 59 | Admitting: Physician Assistant

## 2022-06-07 DIAGNOSIS — D494 Neoplasm of unspecified behavior of bladder: Secondary | ICD-10-CM

## 2022-06-07 DIAGNOSIS — C679 Malignant neoplasm of bladder, unspecified: Secondary | ICD-10-CM

## 2022-06-07 LAB — URINALYSIS, ROUTINE W REFLEX MICROSCOPIC
Bilirubin, UA: NEGATIVE
Glucose, UA: NEGATIVE
Ketones, UA: NEGATIVE
Nitrite, UA: NEGATIVE
RBC, UA: NEGATIVE
Specific Gravity, UA: 1.015 (ref 1.005–1.030)
Urobilinogen, Ur: 0.2 mg/dL (ref 0.2–1.0)
pH, UA: 8.5 — ABNORMAL HIGH (ref 5.0–7.5)

## 2022-06-07 LAB — MICROSCOPIC EXAMINATION: Bacteria, UA: NONE SEEN

## 2022-06-07 MED ORDER — BCG LIVE 50 MG IS SUSR
3.2400 mL | Freq: Once | INTRAVESICAL | Status: AC
  Administered 2022-06-07: 81 mg via INTRAVESICAL

## 2022-06-07 NOTE — Progress Notes (Signed)
BCG Bladder Instillation  BCG # 5 of 6  Due to Bladder Cancer patient is present today for a BCG treatment. Patient was cleaned and prepped in a sterile fashion with betadine. A 14FR catheter was inserted, urine return was noted 73m, urine was yellow in color.  566mof reconstituted BCG was instilled into the bladder. The catheter was then removed. Patient tolerated well, no complications were noted  Performed by: AmEstill BambergN  Follow up/ Additional notes: weekly BCG treatment   Procedure reviewed. Agree with clinical documentation.  Julienne A Summerlin PA-C

## 2022-06-13 NOTE — Progress Notes (Signed)
CARDIOLOGY CONSULT NOTE       Patient ID: Rodney Moreno MRN: [id number] DOB/AGE: [birth year] 71 y.o.  Admit date: (Not on file) Referring Physician: Gerarda Fraction Primary Physician: Redmond School, MD Primary Cardiologist: New Reason for Consultation: HTN  Active Problems:   * No active hospital problems. *   HPI:  71 y.o. referred by Dr Gerarda Fraction for HTN.  History of OSA  He is being Rx with BCG for bladder cancer He takes Zetia for HLD although I see no allergy to statins. 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  Recent cath last year with no obstructive CAD  Intolerant to statins with severe myalgias Only on Zetia  ROS All other systems reviewed and negative except as noted above  Past Medical History:  Diagnosis Date   Hypertension    Myocardial infarction ([place])    Sleep apnea     Family History  Problem Relation Age of Onset   Cancer Father     Social History   Socioeconomic History   Marital status: Divorced    Spouse name: Not on file   Number of children: Not on file   Years of education: Not on file   Highest education level: Not on file  Occupational History   Not on file  Tobacco Use   Smoking status: Never   Smokeless tobacco: Never  Vaping Use   Vaping Use: Never used  Substance and Sexual Activity   Alcohol use: Never   Drug use: Never   Sexual activity: Not on file  Other Topics Concern   Not on file  Social History Narrative   Not on file   Social Determinants of Health   Financial Resource Strain: Not on file   Food Insecurity: Not on file  Transportation Needs: Not on file  Physical Activity: Not on file  Stress: Not on file  Social Connections: Not on file  Intimate Partner Violence: Not on file    Past Surgical History:  Procedure Laterality Date   BACK SURGERY  2003   BLADDER INSTILLATION N/A 03/03/2022   Procedure: BLADDER INSTILLATION-GEMCITABINE;  Surgeon: Cleon Gustin, MD;  Location: [facility];  Service: Urology;  Laterality: N/A;   CYSTOSCOPY N/A 03/28/2022   Procedure: CYSTOSCOPY;  Surgeon: Cleon Gustin, MD;  Location: [facility];  Service: Urology;  Laterality: N/A;   CYSTOSCOPY W/ RETROGRADES Bilateral 03/03/2022   Procedure: CYSTOSCOPY WITH RETROGRADE PYELOGRAM;  Surgeon: Cleon Gustin, MD;  Location: [facility];  Service: Urology;  Laterality: Bilateral;   MOUTH SURGERY     TRANSURETHRAL RESECTION OF BLADDER TUMOR N/A 03/03/2022   Procedure: TRANSURETHRAL RESECTION OF BLADDER TUMOR (TURBT);  Surgeon: Cleon Gustin, MD;  Location: [facility];  Service: Urology;  Laterality: N/A;   TRANSURETHRAL RESECTION OF BLADDER TUMOR N/A 03/28/2022   Procedure: TRANSURETHRAL RESECTION OF BLADDER TUMOR (TURBT);  Surgeon: Cleon Gustin, MD;  Location: [facility];  Service: Urology;  Laterality: N/A;      Current Outpatient Medications:    allopurinol (ZYLOPRIM) 300 MG tablet, Take 300 mg by  mouth daily., Disp: , Rfl:    enalapril-hydrochlorothiazide (VASERETIC) 10-25 MG tablet, Take 1 tablet by mouth daily., Disp: , Rfl:    ezetimibe (ZETIA) 10 MG tablet, Take 10 mg by mouth daily., Disp: , Rfl:    oxyCODONE-acetaminophen (PERCOCET) 5-325 MG tablet, Take 1 tablet by mouth every 4 (four) hours as needed for severe pain., Disp: 30 tablet, Rfl: 0   aspirin EC 81 MG tablet, Take 81 mg by mouth daily. Swallow whole. (Patient not taking: Reported on 06/17/2022), Disp: , Rfl:     Physical Exam: Blood pressure 128/78, pulse 68, height '5\' 10"'$  (1.778 m), weight 277 lb 3.2 oz (125.7 kg), SpO2 94  %.    Affect appropriate Healthy:  appears stated age 71: normal Neck supple with no adenopathy JVP normal no bruits no thyromegaly Lungs clear with no wheezing and good diaphragmatic motion Heart:  S1/S2 no murmur, no rub, gallop or click PMI normal Abdomen: benighn, BS positve, no tenderness, no AAA no bruit.  No HSM or HJR Distal pulses intact with no bruits No edema Neuro non-focal Skin warm and dry No muscular weakness   Labs:   Lab Results  Component Value Date   WBC 7.3 06/23/2008   HGB 14.5 06/23/2008   HCT 42.3 06/23/2008   MCV 92.4 06/23/2008   PLT 232 06/23/2008   No results for input(s): "NA", "K", "CL", "CO2", "BUN", "CREATININE", "CALCIUM", "PROT", "BILITOT", "ALKPHOS", "ALT", "AST", "GLUCOSE" in the last 168 hours.  Invalid input(s): "LABALBU" No results found for: "CKTOTAL", "CKMB", "CKMBINDEX", "TROPONINI" No results found for: "CHOL" No results found for: "HDL" No results found for: "LDLCALC" No results found for: "TRIG" No results found for: "CHOLHDL" No results found for: "LDLDIRECT"    Radiology: [place] FNA BX THYROID 1ST LESION AFIRMA  Result Date: 06/11/2022 INDICATION: Indeterminate thyroid nodule EXAM: ULTRASOUND GUIDED FINE NEEDLE ASPIRATION OF INDETERMINATE THYROID NODULE COMPARISON:  None Available. MEDICATIONS: None COMPLICATIONS: None immediate. TECHNIQUE: Informed written consent was obtained from the patient after a discussion of the risks, benefits and alternatives to treatment. Questions regarding the procedure were encouraged and answered. A timeout was performed prior to the initiation of the procedure. Pre-procedural ultrasound scanning demonstrated unchanged size and appearance of the indeterminate nodule within the thyroid isthmus. The procedure was planned. The neck was prepped in the usual sterile fashion, and a sterile drape was applied covering the operative field. A timeout was performed prior to the initiation of the procedure.  Local anesthesia was provided with 1% lidocaine. Under direct ultrasound guidance, 5 FNA biopsies were performed of the nodule with a 25 gauge needle. Two needle passes were reserved for future Afirma testing if needed. Multiple ultrasound images were saved for procedural documentation purposes. The samples were prepared and submitted to pathology. Limited post procedural scanning was negative for hematoma or additional complication. Dressings were placed. The patient tolerated the above procedures procedure well without immediate postprocedural complication. FINDINGS: Nodule reference number based on prior diagnostic ultrasound: 1 Maximum size: 1.9 cm Location: Isthmus; Inferior ACR TI-RADS risk category: TR4 (4-6 points) Reason for biopsy: meets ACR TI-RADS criteria Ultrasound imaging confirms appropriate placement of the needles within the thyroid nodule. IMPRESSION: Technically successful ultrasound guided fine needle aspiration of TI-RADS category 4 nodule in the thyroid isthmus. Electronically Signed   By: Jacqulynn Cadet M.D.   On: 06/11/2022 06:31   CT ANGIO CHEST AORTA W/CM & OR WO/CM  Result Date: 05/31/2022 CLINICAL DATA:  Known thoracic aneurysm follow-up.  Asymptomatic. EXAM: CT  ANGIOGRAPHY CHEST WITH CONTRAST TECHNIQUE: Multidetector CT imaging of the chest was performed using the standard protocol during bolus administration of intravenous contrast. Multiplanar CT image reconstructions and MIPs were obtained to evaluate the vascular anatomy. RADIATION DOSE REDUCTION: This exam was performed according to the departmental dose-optimization program which includes automated exposure control, adjustment of the mA and/or kV according to patient size and/or use of iterative reconstruction technique. CONTRAST:  113m OMNIPAQUE IOHEXOL 350 MG/ML SOLN COMPARISON:  None Available. FINDINGS: Cardiovascular: Borderline cardiomegaly. Calcified plaque is present over the left anterior descending coronary  artery. Ascending thoracic aortic aneurysm measuring 4.5 cm in diameter on the axial images (image 485, series 6). Aortic root measures 4.1 cm. Sinotubular junction measures 3.4 cm. Aortic arch and descending thoracic aorta are normal in caliber. There is minimal calcified plaque over the thoracic aorta. Normal 3 vessel takeoff from the aortic arch. Remaining vascular structures are unremarkable. Mediastinum/Nodes: No mediastinal or hilar adenopathy. 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 Lungs/Pleura: Lungs are adequately inflated without focal airspace consolidation or effusion. No suspicious pulmonary nodules/masses. Airways are normal. Upper Abdomen: No acute findings. Minimal calcified plaque over the abdominal aorta. Musculoskeletal: No focal abnormality. Review of the MIP images confirms the above findings. IMPRESSION: 1. No acute cardiopulmonary disease. 2. 4.5 cm ascending thoracic aortic aneurysm. Recommend comparison with prior exam if available. Recommend semi-annual imaging followup by CTA or MRA and referral to cardiothoracic surgery if not already obtained. This recommendation follows 2010 ACCF/AHA/AATS/ACR/ASA/SCA/SCAI/SIR/STS/SVM Guidelines for the Diagnosis and Management of Patients With Thoracic Aortic Disease. Circulation. 2010; 121:: P950-D326 Aortic aneurysm NOS (ICD10-I71.9). 3. Aortic atherosclerosis. Atherosclerotic coronary artery disease. 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 Aortic Atherosclerosis (ICD10-I70.0). Electronically Signed   By: DMarin OlpM.D.   On: 05/31/2022 10:20   [place]  Result Date: 05/24/2022 CLINICAL DATA:  Prior ultrasound follow-up. EXAM: THYROID ULTRASOUND TECHNIQUE: Ultrasound examination of the thyroid gland and adjacent soft tissues was performed. COMPARISON:  October, December 2022 FINDINGS: Parenchymal Echotexture: Moderately heterogenous Isthmus: 0.7 cm Right lobe: 5.6 x 2.2 x 2.0 cm Left lobe: 6.3 x 2.5 x  1.9 cm _________________________________________________________ Estimated total number of nodules >/= 1 cm: 4 Number of spongiform nodules >/=  2 cm not described below (TR1): 0 Number of mixed cystic and solid nodules >/= 1.5 cm not described below (TR2): 0 _________________________________________________________ Nodule labeled 1 is a solid hypoechoic TR 4 nodule in the thyroid isthmus that measures 1.9 x 1.9 x 1.2 cm, previously measuring 1.2 cm. This nodule is intervally enlarged, and **Given size (>/= 1.5 cm) and appearance, fine needle aspiration of this moderately suspicious nodule should be considered based on TI-RADS criteria. Nodule labeled 2 is a solid hypoechoic TR 4 nodule in the inferior right thyroid lobe that measures 1.4 x 1.1 x 1.1 cm, previously measuring up to 1.1 cm. It is similar to slightly increased in size. *Given size (>/= 1 - 1.4 cm) and appearance, a follow-up ultrasound in 1 year should be considered based on TI-RADS criteria. Note that the nodule labeled 3 on previous exam is not definitively visualized on today's images. Nodule labeled 3 on this exam is a solid isoechoic TR 3 nodule in the mid left thyroid lobe that measures 1.2 cm. Given size (<1.4 cm) and appearance, this nodule does NOT meet TI-RADS criteria for biopsy or dedicated follow-up. Nodule labeled 4 is a large solid nodule in the inferior left thyroid lobe that was previously biopsied, and  measures 3.1 cm on today's exam, previously 3.2 cm. It remains similar in size and morphology. IMPRESSION: 1. Enlarged multinodular thyroid gland. 2. Nodule labeled 1 in the thyroid isthmus (1.9 cm TR 4) demonstrates interval enlargement, and now meets criteria for biopsy. 3. Nodule labeled 4 in the inferior left thyroid lobe was previously biopsied, and remains similar in size and morphology. Correlate with biopsy results. 4. Nodule labeled 2 is similar to slightly increased in size, and continues to meet criteria for follow-up  ultrasound in 1 year. This exam marks 1 year follow-up. A total follow-up interval of 5 years is recommended. The above is in keeping with the ACR TI-RADS recommendations - J Am Coll Radiol 2017;14:587-595. Electronically Signed   By: Albin Felling M.D.   On: 05/24/2022 09:04    EKG: SR LBBB rate 86 03/04/22   ASSESSMENT AND PLAN:   HTN:  Well controlled.  Continue current medications and low sodium Dash type diet.   HLD:  refer to Pharm D start Repatha/Praluent given high Calcium score and aneurysm Aortic Aneurysm:  4.5 cm by recent CTA F/U scan in March refer to CVTS   Thyroid:  f/u primary for TSH Bladder Cancer:  Continue f/u [facility] BCG RX OSA:  CPAP weight loss    PSK9 F/U 6 months   Signed: Jenkins Rouge 06/17/2022, 8:28 AM

## 2022-06-14 NOTE — Progress Notes (Unsigned)
[address]       [place] [address]             [phone]        Tracker C Gregson [id number] April 18, 1951   History of Present Illness: Mr. Ellers is a 71 year old male with a past medical history of bladder cancer, HTN, HLD, sleep apnea, MI, gout, left bundle branch block and multinodular goiter. He just finished his last BCG treatment for bladder cancer. On 03/19/22 he was brought to the ED for LOC and gross hematuria after removal of his bladder cancer tumor. On CTA of chest/abdomen/pelvis a 4.5cm ascending thoracic aneurysm was noted. On 05/30/22 CTA showed a stable 4.5cm ascending thoracic aortic aneurysm. He recently moved from [place] to [place]. He states he has been followed for his thoracic aneurysm since 2020 when it was originally found in a CT calcium score which showed coronary artery disease and a 4.7cm ascending thoracic aortic aneurysm. Heart catheterization showed nonobstructive coronary disease. In 2021 CTA showed a 4.8cm ascending thoracic aneurysm, he was followed every 6 months with CTA which measured 4.6cm over the last 2 visits with the last one being May 24, 2021. Echocardiogram in March 2022 shows a tricuspid aortic valve without abnormalities. He denies a family history of TAA and personal history of connective tissue disease. He denies chest pain, chest tightness, shortness of breath, dyspnea with exertion, and dizziness.   Past Medical History:  Diagnosis Date   Hypertension    Myocardial infarction [facility])    Sleep apnea     Current Outpatient Medications on File Prior to Visit  Medication Sig Dispense Refill   allopurinol (ZYLOPRIM) 300 MG tablet Take 300 mg by mouth daily.     aspirin EC 81 MG tablet Take 81 mg by mouth daily. Swallow whole.     enalapril-hydrochlorothiazide (VASERETIC) 10-25 MG tablet Take 1 tablet by mouth daily.     ezetimibe (ZETIA) 10 MG tablet Take 10 mg by mouth daily.     oxyCODONE-acetaminophen  (PERCOCET) 5-325 MG tablet Take 1 tablet by mouth every 4 (four) hours as needed for severe pain. 30 tablet 0   sulfamethoxazole-trimethoprim (BACTRIM DS) 800-160 MG tablet Take 1 tablet by mouth 2 (two) times daily. 14 tablet 0   sulfamethoxazole-trimethoprim (BACTRIM DS) 800-160 MG tablet Take 1 tablet by mouth 2 (two) times daily. 14 tablet 0   No current facility-administered medications on file prior to visit.   Vitals: Today's Vitals   06/16/22 1403  BP: (!) 147/80  Pulse: 67  Resp: 20  SpO2: 96%  Weight: 275 lb (124.7 kg)  Height: '5\' 10"'$  (1.778 m)   Body mass index is 39.46 kg/m.   Review of Systems  Constitutional:  Negative for chills, diaphoresis, fever, malaise/fatigue and weight loss.  HENT:  Negative for hearing loss.   Eyes:  Negative for blurred vision.  Respiratory:  Negative for cough, sputum production, shortness of breath and wheezing.   Cardiovascular:  Positive for orthopnea. Negative for chest pain, palpitations and leg swelling.       Sleep apnea requiring CPAP  Gastrointestinal:  Negative for constipation, diarrhea and heartburn.  Genitourinary:  Positive for dysuria and hematuria.  Musculoskeletal:  Negative for joint pain and myalgias.  Neurological:  Positive for loss of consciousness. Negative for dizziness, weakness and headaches.  Endo/Heme/Allergies:  Does not bruise/bleed easily.  Psychiatric/Behavioral:  Negative for depression. The patient is not nervous/anxious.  Physical Exam General: Alert, no distress  Neuro: Grossly intact Neck: No carotid bruits CV: regular rate and rhythm, no murmurs, rubs, gallops Pulm: Clear to auscultation bilaterally GI: Normal bowel sounds, no tenderness, no distension Extremities: No edema, radial pulses 3+ bilaterally  CTA Results:  CLINICAL DATA:  Known thoracic aneurysm follow-up.  Asymptomatic.   EXAM: CT ANGIOGRAPHY CHEST WITH CONTRAST   TECHNIQUE: Multidetector CT imaging of the chest was  performed using the standard protocol during bolus administration of intravenous contrast. Multiplanar CT image reconstructions and MIPs were obtained to evaluate the vascular anatomy.   RADIATION DOSE REDUCTION: This exam was performed according to the departmental dose-optimization program which includes automated exposure control, adjustment of the mA and/or kV according to patient size and/or use of iterative reconstruction technique.   CONTRAST:  153m OMNIPAQUE IOHEXOL 350 MG/ML SOLN   COMPARISON:  None Available.   FINDINGS: Cardiovascular: Borderline cardiomegaly. Calcified plaque is present over the left anterior descending coronary artery. Ascending thoracic aortic aneurysm measuring 4.5 cm in diameter on the axial images (image 485, series 6). Aortic root measures 4.1 cm. Sinotubular junction measures 3.4 cm. Aortic arch and descending thoracic aorta are normal in caliber. There is minimal calcified plaque over the thoracic aorta. Normal 3 vessel takeoff from the aortic arch. Remaining vascular structures are unremarkable.   Mediastinum/Nodes: No mediastinal or hilar adenopathy. 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   Lungs/Pleura: Lungs are adequately inflated without focal airspace consolidation or effusion. No suspicious pulmonary nodules/masses. Airways are normal.   Upper Abdomen: No acute findings. Minimal calcified plaque over the abdominal aorta.   Musculoskeletal: No focal abnormality.   Review of the MIP images confirms the above findings.   IMPRESSION: 1. No acute cardiopulmonary disease. 2. 4.5 cm ascending thoracic aortic aneurysm. Recommend comparison with prior exam if available. Recommend semi-annual imaging followup by CTA or MRA and referral to cardiothoracic surgery if not already obtained. This recommendation follows 2010 ACCF/AHA/AATS/ACR/ASA/SCA/SCAI/SIR/STS/SVM Guidelines for the Diagnosis and Management of  Patients With Thoracic Aortic Disease. Circulation. 2010; 121:: D149-F026 Aortic aneurysm NOS (ICD10-I71.9). 3. Aortic atherosclerosis. Atherosclerotic coronary artery disease. 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   Aortic Atherosclerosis (ICD10-I70.0).     Electronically Signed   By: DMarin OlpM.D.   On: 05/31/2022 10:20    A/P:  Ascending thoracic aortic aneurysm: Mr. JBlazieris a 71year old male who was incidentally found to have an ascending thoracic aortic aneurysm in November of 2020. [place] and it has been stable measuring 4.7, 4.8, 4.6, 4.6 over the last 3 years. [place]19[place] because of a bladder hematoma after cancer tumor removal. The CT chest/abdomen/pelvis noted a 4.5cm thoracic aneurysm. This was confirmed with a chest CTA on 05/31/22 which again showed a 4.5cm thoracic aneurysm. He denies chest pain, chest tightness, shortness of breath and dizziness. His ascending thoracic aortic aneurysm measures 4.5cm which has been stable since 2020. At 4.5cm he does not meet surgical threshold. Will plan for follow up with CTA in 6 months. Patient would like to see a surgeon from now on.   Hypertension: His blood pressure is slightly elevated at 147/80 today but he states he checks it at home and it is usually in the 1378'Hsystolic. He takes [employer], manufacturing systemsbut states they will no longer  provide this due to a shortage. He sees his new cardiologist tomorrow and has an appointment set up with a PCP this month as well. I will not make any medication adjustments at this point, but his Vaseretic will need to be substituted, encouraged him to speak with his cardiologist about substitutions.   Gout: Patient has reoccurring gout for which he takes allopurinol but would like colchicine as well. I told him to speak to his PCP about this.    Reviewed the following risk modification:  Statin:  Does not tolerate statins, on ezetimibe  Smoking cessation instruction/counseling given:  Never smoker  Patient was counseled on importance of Blood Pressure Control.  Despite Medical intervention if the patient notices persistently elevated blood pressure readings they are instructed to contact their Primary Care Physician  Please avoid use of Fluoroquinolones as this can potentially increase your risk of Aortic Rupture and/or Dissection  Patient educated on signs and symptoms of Aortic Dissection, handout also provided in AVS  Exercise and activity limitations is individualized, but in general, contact sports are to be avoided and one should avoid heavy lifting (defined as half of ideal body weight) and exercises involving sustained Valsalva maneuver.  Magdalene River, PA-C 06/14/22

## 2022-06-15 ENCOUNTER — Ambulatory Visit: Payer: 59 | Admitting: Physician Assistant

## 2022-06-15 ENCOUNTER — Ambulatory Visit (INDEPENDENT_AMBULATORY_CARE_PROVIDER_SITE_OTHER): Payer: 59 | Admitting: Physician Assistant

## 2022-06-15 DIAGNOSIS — C679 Malignant neoplasm of bladder, unspecified: Secondary | ICD-10-CM | POA: Diagnosis not present

## 2022-06-15 DIAGNOSIS — D494 Neoplasm of unspecified behavior of bladder: Secondary | ICD-10-CM

## 2022-06-15 MED ORDER — BCG LIVE 50 MG IS SUSR
3.2400 mL | Freq: Once | INTRAVESICAL | Status: AC
  Administered 2022-06-15: 81 mg via INTRAVESICAL

## 2022-06-15 NOTE — Progress Notes (Signed)
BCG Bladder Instillation  BCG # 6 of 6  Due to Bladder Cancer patient is present today for a BCG treatment. Patient was cleaned and prepped in a sterile fashion with betadine. A 14FR catheter was inserted, urine return was noted 72m, urine was yellow in color.  559mof reconstituted BCG was instilled into the bladder. The catheter was then removed. Patient tolerated well, no complications were noted  Performed by: KoLevi AlandCMA  Follow up/ Additional notes: Follow up in 1 month for Cysto with MD.    Procedure reviewed. Agree with clinical documentation.  Julienne A Summerlin PA-C

## 2022-06-16 ENCOUNTER — Institutional Professional Consult (permissible substitution) (INDEPENDENT_AMBULATORY_CARE_PROVIDER_SITE_OTHER): Payer: 59 | Admitting: Physician Assistant

## 2022-06-16 VITALS — BP 147/80 | HR 67 | Resp 20 | Ht 70.0 in | Wt 275.0 lb

## 2022-06-16 DIAGNOSIS — I7121 Aneurysm of the ascending aorta, without rupture: Secondary | ICD-10-CM

## 2022-06-16 LAB — URINALYSIS, ROUTINE W REFLEX MICROSCOPIC
Bilirubin, UA: NEGATIVE
Glucose, UA: NEGATIVE
Ketones, UA: NEGATIVE
Nitrite, UA: NEGATIVE
RBC, UA: NEGATIVE
Specific Gravity, UA: 1.02 (ref 1.005–1.030)
Urobilinogen, Ur: 0.2 mg/dL (ref 0.2–1.0)
pH, UA: 6 (ref 5.0–7.5)

## 2022-06-16 NOTE — Patient Instructions (Addendum)
Risk Modification in those with ascending thoracic aortic aneurysm:  Continue good control of blood pressure (prefer SBP 130/80 or less)  2. Avoid fluoroquinolone antibiotics (I.e Ciprofloxacin, Avelox, Levofloxacin, Ofloxacin)  3.  Use of statin (to decrease cardiovascular risk)  4.  Exercise and activity limitations is individualized, but in general, contact sports are to be  avoided and one should avoid heavy lifting (defined as half of ideal body weight) and exercises involving sustained Valsalva maneuver.  5. Counseling for those suspected of having genetically mediated disease. First-degree relatives of those with TAA disease should be screened as well as those who have a connective tissue disease (I.e with Marfan syndrome, Ehlers-Danlos syndrome,  and Loeys-Dietz syndrome) or a  bicuspid aortic valve,have an increased risk for  complications related to TAA  6. If one has tobacco abuse, smoking cessation is highly encouraged.  

## 2022-06-17 ENCOUNTER — Ambulatory Visit: Payer: 59 | Attending: Cardiovascular Disease | Admitting: Cardiovascular Disease

## 2022-06-17 ENCOUNTER — Encounter: Payer: Self-pay | Admitting: Cardiovascular Disease

## 2022-06-17 ENCOUNTER — Telehealth: Payer: 59 | Admitting: Family Medicine

## 2022-06-17 VITALS — BP 128/78 | HR 68 | Ht 70.0 in | Wt 277.2 lb

## 2022-06-17 DIAGNOSIS — C670 Malignant neoplasm of trigone of bladder: Secondary | ICD-10-CM

## 2022-06-17 DIAGNOSIS — I2510 Atherosclerotic heart disease of native coronary artery without angina pectoris: Secondary | ICD-10-CM | POA: Diagnosis not present

## 2022-06-17 DIAGNOSIS — E785 Hyperlipidemia, unspecified: Secondary | ICD-10-CM | POA: Diagnosis not present

## 2022-06-17 DIAGNOSIS — I7121 Aneurysm of the ascending aorta, without rupture: Secondary | ICD-10-CM | POA: Diagnosis not present

## 2022-06-17 NOTE — Patient Instructions (Signed)
Medication Instructions:  Your physician recommends that you continue on your current medications as directed. Please refer to the Current Medication list given to you today.  *If you need a refill on your cardiac medications before your next appointment, please call your pharmacy*   Lab Work: NONE   If you have labs (blood work) drawn today and your tests are completely normal, you will receive your results only by: [place] (if you have MyChart) OR A paper copy in the mail If you have any lab test that is abnormal or we need to change your treatment, we will call you to review the results.   Testing/Procedures: NONE    Follow-Up: At [facility], you and your health needs are our priority.  As part of our continuing mission to provide you with exceptional heart care, we have created designated Provider Care Teams.  These Care Teams include your primary Cardiologist (physician) and Advanced Practice Providers (APPs -  Physician Assistants and Nurse Practitioners) who all work together to provide you with the care you need, when you need it.  We recommend signing up for the patient portal called "MyChart".  Sign up information is provided on this After Visit Summary.  MyChart is used to connect with patients for Virtual Visits (Telemedicine).  Patients are able to view lab/test results, encounter notes, upcoming appointments, etc.  Non-urgent messages can be sent to your provider as well.   To learn more about what you can do with MyChart, go to [email or web address].    Your next appointment:   6 month(s)  The format for your next appointment:   In Person  Provider:   Jenkins Rouge, MD    Other Instructions Thank you for choosing [place]!    Important Information About Sugar

## 2022-06-17 NOTE — Progress Notes (Signed)
Pt reports he was prompted to make apptmt on telehealth but doesn't know why. He was seen at his cardiologist today and will call them to see if they needed to talk with him. He denies any concerns today. I have explained our role in the [facility] system. DWB

## 2022-07-25 ENCOUNTER — Encounter: Payer: Self-pay | Admitting: Urology

## 2022-07-25 ENCOUNTER — Ambulatory Visit (INDEPENDENT_AMBULATORY_CARE_PROVIDER_SITE_OTHER): Payer: 59 | Admitting: Urology

## 2022-07-25 VITALS — BP 142/79 | HR 103

## 2022-07-25 DIAGNOSIS — Z8551 Personal history of malignant neoplasm of bladder: Secondary | ICD-10-CM | POA: Diagnosis not present

## 2022-07-25 DIAGNOSIS — D494 Neoplasm of unspecified behavior of bladder: Secondary | ICD-10-CM

## 2022-07-25 LAB — URINALYSIS, ROUTINE W REFLEX MICROSCOPIC
Bilirubin, UA: NEGATIVE
Glucose, UA: NEGATIVE
Ketones, UA: NEGATIVE
Nitrite, UA: NEGATIVE
RBC, UA: NEGATIVE
Specific Gravity, UA: 1.015 (ref 1.005–1.030)
Urobilinogen, Ur: 1 mg/dL (ref 0.2–1.0)
pH, UA: 7 (ref 5.0–7.5)

## 2022-07-25 LAB — MICROSCOPIC EXAMINATION

## 2022-07-25 MED ORDER — CIPROFLOXACIN HCL 500 MG PO TABS
500.0000 mg | ORAL_TABLET | Freq: Once | ORAL | Status: AC
  Administered 2022-07-25: 500 mg via ORAL

## 2022-07-25 NOTE — Patient Instructions (Signed)

## 2022-07-25 NOTE — Progress Notes (Signed)
   07/25/22  CC: followup bladder cancer   HPI: [name] is a 71yo here for followup for bladder cancer Blood pressure (!) 142/79, pulse (!) 103. NED. A&Ox3.   No respiratory distress   Abd soft, NT, ND Normal phallus with bilateral descended testicles  Cystoscopy Procedure Note  Patient identification was confirmed, informed consent was obtained, and patient was prepped using Betadine solution.  Lidocaine jelly was administered per urethral meatus.     Pre-Procedure: - Inspection reveals a normal caliber ureteral meatus.  Procedure: The flexible cystoscope was introduced without difficulty - No urethral strictures/lesions are present. - Enlarged prostate  - Normal bladder neck - Bilateral ureteral orifices identified - Bladder mucosa  reveals no ulcers, tumors, or lesions - No bladder stones - No trabeculation  Right dome wall scar from previous resection   Post-Procedure: - Patient tolerated the procedure well  Assessment/ Plan: Followup 3 months for cystoscopy  No follow-ups on file.  Nicolette Bang, MD

## 2022-08-23 NOTE — Progress Notes (Unsigned)
Patient ID: Rodney Moreno                 DOB: July 24, 1951                    MRN: [id number]      HPI: Rodney Moreno is a 71 y.o. male patient referred to lipid clinic by Dr.Nishan. PMH is significant for gout,hypertension, HDL, hx of bladder cancer, aortic aneurysm, morbid obesity.   Today patient has no acute concern. PCP has put him on Repatha so far used 3 doses. No longer taking ezetimibe. Patient reports his BP varies at home but he notices more readings in 160/90 range since PCP changed enalapril- HCTZ 10/25 to losartan 50 mg. Denies dizziness, palpitation, headaches or SOB. Reports home cuff hs not been validated. Try to eat healthy food; eats out 1-2 times per day.    Current Medications:  Repatha 140 mg every 14 days  Intolerances: Statins  Risk Factors: Coronary Calcium score 1028,hypertension, obesity  LDL goal: <100 for primary prevention    Diet: no fried food, does not add salt to cooked food but eats out 1-2 meals per day  Encouraged to eat healthy home cooked meal with less fat and salt     Exercise: walks 1 mile per day, will be going to gym starting from Feb    Social History:  Alcohol: none Smoking : never   Labs: Lipid Panel TG 74, TC 148 HDL 48 LDLc 85 12/23/2021 from [facility].    Past Medical History:  Diagnosis Date   Hypertension    Myocardial infarction [facility])    Sleep apnea     Current Outpatient Medications on File Prior to Visit  Medication Sig Dispense Refill   allopurinol (ZYLOPRIM) 300 MG tablet Take 300 mg by mouth daily.     aspirin EC 81 MG tablet Take 81 mg by mouth daily. Swallow whole. (Patient not taking: Reported on 06/17/2022)     enalapril-hydrochlorothiazide (VASERETIC) 10-25 MG tablet Take 1 tablet by mouth daily.     oxyCODONE-acetaminophen (PERCOCET) 5-325 MG tablet Take 1 tablet by mouth every 4 (four) hours as needed for severe pain. 30 tablet 0   No current facility-administered medications on file prior to visit.    No Known  Allergies  Assessment/Plan:  1. Hyperlipidemia -  Problem  Hyperlipidemia    Current Medications:  Repatha 140 mg every 14 days  Intolerances: Statins  Risk Factors: Coronary Calcium score 1028,hypertension, obesity  LDL goal: <100 for primary prevention       Hyperlipidemia Assessment:  LDL goal: < 100 mg/dl  Unable to tolerate statins Currently on Repatha 140 mg every 14 days(received 3 injections so far) ; tolerates it well   Plan: Continue taking current medications Repatha 140 mg every 14 days Will repeat fasting lipid lab in 3-4 weeks  Hypertension  Assessment/plan: BP controlled; goal <130/80. In office BP 128/80.  Enalapril HCTZ 10/25 was switched to losartan 50 mg due to gout episode  Home BP few readings ~160/90 range; home cuff is never validated Patient to start checking BP at home regularly and report in 2 weeks via MyChart May consider increasing losartan 50 mg daily to twice daily     Thank you,  Cammy Copa, Pharm.D [facility] A Division of [facility] [place] [address] [place], [place] [address]  Phone: [phone]; Fax: [phone]

## 2022-08-24 ENCOUNTER — Ambulatory Visit: Payer: 59 | Attending: Interventional Cardiology | Admitting: Student

## 2022-08-24 VITALS — BP 128/80

## 2022-08-24 DIAGNOSIS — E785 Hyperlipidemia, unspecified: Secondary | ICD-10-CM | POA: Insufficient documentation

## 2022-08-24 DIAGNOSIS — I2510 Atherosclerotic heart disease of native coronary artery without angina pectoris: Secondary | ICD-10-CM

## 2022-08-24 MED ORDER — REPATHA SURECLICK 140 MG/ML ~~LOC~~ SOAJ: 140.0000 mg | SUBCUTANEOUS | 3 refills | Status: DC

## 2022-08-24 NOTE — Assessment & Plan Note (Signed)
Assessment:  LDL goal: < 100 mg/dl  Unable to tolerate statins Currently on Repatha 140 mg every 14 days(received 3 injections so far) ; tolerates it well   Plan: Continue taking current medications Repatha 140 mg every 14 days Will repeat fasting lipid lab in 3-4 weeks

## 2022-09-28 ENCOUNTER — Ambulatory Visit: Payer: 59 | Admitting: Cardiology

## 2022-10-05 ENCOUNTER — Encounter (HOSPITAL_COMMUNITY): Payer: Self-pay | Admitting: *Deleted

## 2022-10-05 ENCOUNTER — Emergency Department (HOSPITAL_COMMUNITY)
Admission: EM | Admit: 2022-10-05 | Discharge: 2022-10-05 | Disposition: A | Discharge status: Home / Self Care | Payer: Medicare HMO | Attending: Emergency Medicine | Admitting: Emergency Medicine

## 2022-10-05 ENCOUNTER — Other Ambulatory Visit: Payer: Self-pay

## 2022-10-05 DIAGNOSIS — I10 Essential (primary) hypertension: Secondary | ICD-10-CM | POA: Diagnosis not present

## 2022-10-05 DIAGNOSIS — Z5321 Procedure and treatment not carried out due to patient leaving prior to being seen by health care provider: Secondary | ICD-10-CM | POA: Insufficient documentation

## 2022-10-05 DIAGNOSIS — R519 Headache, unspecified: Secondary | ICD-10-CM | POA: Insufficient documentation

## 2022-10-05 HISTORY — DX: Aortic aneurysm of unspecified site, without rupture: I71.9

## 2022-10-05 HISTORY — DX: Malignant neoplasm of bladder, unspecified: C67.9

## 2022-10-05 NOTE — ED Triage Notes (Addendum)
Pt c/o hypertension and headache intermittently for a few weeks. Pt took his BP at home today with readings of 165/97 and 176/102. Pt reports he has been trying to get in with his doctor but they can't see him until April or May. Pt concerned today since he started to get a headache in the back of his head. Pt says he gets migraine headaches but this felt different. He took a Tylenol #4 about 1 hour ago with relief in pain. Pt leaving to go to [place] tomorrow and concerned about leaving knowing his BP is high.

## 2022-10-05 NOTE — ED Notes (Signed)
Pt did not want to wait any longer and left

## 2022-10-05 NOTE — ED Notes (Signed)
Pt to nurses station states he is going home because he could not wait any longer he will follow up with PC in the morning. Pt states he is feeling better than he did when he arrived. Ambulated out of ED.

## 2022-10-06 DIAGNOSIS — Z6841 Body Mass Index (BMI) 40.0 and over, adult: Secondary | ICD-10-CM | POA: Diagnosis not present

## 2022-10-06 DIAGNOSIS — I700 Atherosclerosis of aorta: Secondary | ICD-10-CM | POA: Diagnosis not present

## 2022-10-06 DIAGNOSIS — I10 Essential (primary) hypertension: Secondary | ICD-10-CM | POA: Diagnosis not present

## 2022-10-06 DIAGNOSIS — I712 Thoracic aortic aneurysm, without rupture, unspecified: Secondary | ICD-10-CM | POA: Diagnosis not present

## 2022-10-15 NOTE — ED Provider Notes (Signed)
Patient eloped from ED prior to my evaluation.    Audley Hose, MD 10/15/22 [phone]

## 2022-10-19 ENCOUNTER — Other Ambulatory Visit: Payer: 59 | Admitting: Urology

## 2022-11-02 ENCOUNTER — Ambulatory Visit: Payer: Medicare HMO | Admitting: Urology

## 2022-11-02 VITALS — BP 135/90 | HR 96

## 2022-11-02 DIAGNOSIS — Z8551 Personal history of malignant neoplasm of bladder: Secondary | ICD-10-CM | POA: Diagnosis not present

## 2022-11-02 DIAGNOSIS — C679 Malignant neoplasm of bladder, unspecified: Secondary | ICD-10-CM | POA: Diagnosis not present

## 2022-11-02 DIAGNOSIS — D494 Neoplasm of unspecified behavior of bladder: Secondary | ICD-10-CM

## 2022-11-02 LAB — MICROSCOPIC EXAMINATION: Bacteria, UA: NONE SEEN

## 2022-11-02 LAB — URINALYSIS, ROUTINE W REFLEX MICROSCOPIC
Bilirubin, UA: NEGATIVE
Glucose, UA: NEGATIVE
Ketones, UA: NEGATIVE
Leukocytes,UA: NEGATIVE
Nitrite, UA: NEGATIVE
RBC, UA: NEGATIVE
Specific Gravity, UA: 1.025 (ref 1.005–1.030)
Urobilinogen, Ur: 0.2 mg/dL (ref 0.2–1.0)
pH, UA: 5.5 (ref 5.0–7.5)

## 2022-11-02 MED ORDER — CIPROFLOXACIN HCL 500 MG PO TABS
500.0000 mg | ORAL_TABLET | Freq: Once | ORAL | Status: AC
  Administered 2022-11-02: 500 mg via ORAL

## 2022-11-02 NOTE — Progress Notes (Unsigned)
   11/02/22  CC: followup bladder cancer   HPI: Rodney Moreno is a 72yo here for followup for bladder cancer Blood pressure (!) 135/90, pulse 96. NED. A&Ox3.   No respiratory distress   Abd soft, NT, ND Normal phallus with bilateral descended testicles  Cystoscopy Procedure Note  Patient identification was confirmed, informed consent was obtained, and patient was prepped using Betadine solution.  Lidocaine jelly was administered per urethral meatus.     Pre-Procedure: - Inspection reveals a normal caliber ureteral meatus.  Procedure: The flexible cystoscope was introduced without difficulty - No urethral strictures/lesions are present. - Enlarged prostate  - Normal bladder neck - Bilateral ureteral orifices identified - Bladder mucosa  reveals no ulcers, tumors, or lesions - No bladder stones - No trabeculation  Retroflexion shows 1cm intravesical prostatic protrusion   Post-Procedure: - Patient tolerated the procedure well  Assessment/ Plan: Followup 3 months for cystoscopy  No follow-ups on file.  Nicolette Bang, MD

## 2022-11-03 ENCOUNTER — Encounter: Payer: Self-pay | Admitting: Urology

## 2022-11-03 NOTE — Patient Instructions (Signed)

## 2022-11-07 ENCOUNTER — Other Ambulatory Visit: Payer: Self-pay | Admitting: Thoracic Surgery (Cardiothoracic Vascular Surgery)

## 2022-11-07 DIAGNOSIS — I7121 Aneurysm of the ascending aorta, without rupture: Secondary | ICD-10-CM

## 2022-12-05 NOTE — Progress Notes (Signed)
CARDIOLOGY CONSULT NOTE       Patient ID: Rodney Moreno MRN: [id number] DOB/AGE: [birth year] 72 y.o.  Admit date: (Not on file) Referring Physician: Sherwood Moreno Primary Physician: Rodney Moreno Primary Cardiologist: Rodney Moreno Reason for Consultation: HTN     HPI:  72 y.o. referred by Dr Rodney Moreno for HTN.  First seen on 06/17/22 History of OSA  He is being Rx with BCG for bladder cancer He takes Zetia for HLD although I see no allergy to statins. 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  Cath last year 2022  with no obstructive CAD  Intolerant to statins with severe myalgias Now on Repatha as of 07/2022  Went to ED 10/05/22 with headache and HTN Left before evaluation   Discussed changing his BP meds Has been using PRN clonidine which is not ideal Discussed lifestyle changes and weight loss He will discuss Ozempic with Dr Rodney Moreno  [facility] Told him October would be fine  [place] [facility]   ROS All other systems reviewed and negative except as noted above  Past Medical History:  Diagnosis Date   Aortic aneurysm    Bladder cancer    Hypertension    Myocardial infarction    Sleep apnea     Family History  Problem Relation Age of Onset   Cancer Father     Social History   Socioeconomic History   Marital status: Divorced    Spouse name: Not on file   Number of children: Not on file   Years of education: Not on file   Highest education level: Not on file  Occupational History   Not on  file  Tobacco Use   Smoking status: Never   Smokeless tobacco: Never  Vaping Use   Vaping Use: Never used  Substance and Sexual Activity   Alcohol use: Not Currently   Drug use: Never   Sexual activity: Not on file  Other Topics Concern   Not on file  Social History Narrative   Not on file   Social Determinants of Health   Financial Resource Strain: Not on file  Food Insecurity: Not on file  Transportation Needs: Not on file  Physical Activity: Not on file  Stress: Not on file  Social Connections: Not on file  Intimate Partner Violence: Not on file    Past Surgical History:  Procedure Laterality Date   BACK SURGERY  2003   BLADDER INSTILLATION N/A 03/03/2022   Procedure: BLADDER INSTILLATION-GEMCITABINE;  Surgeon: Rodney Moreno;  Location: [facility];  Service: Urology;  Laterality: N/A;   CYSTOSCOPY N/A 03/28/2022   Procedure: CYSTOSCOPY;  Surgeon: Rodney Moreno;  Location: [facility];  Service: Urology;  Laterality: N/A;   CYSTOSCOPY W/ RETROGRADES Bilateral 03/03/2022   Procedure: CYSTOSCOPY WITH RETROGRADE PYELOGRAM;  Surgeon: Rodney Moreno;  Location: [facility];  Service: Urology;  Laterality: Bilateral;   MOUTH SURGERY     TRANSURETHRAL RESECTION OF BLADDER TUMOR N/A 03/03/2022   Procedure: TRANSURETHRAL  RESECTION OF BLADDER TUMOR (TURBT);  Surgeon: Rodney Gauze, Moreno;  Location: [facility];  Service: Urology;  Laterality: N/A;   TRANSURETHRAL RESECTION OF BLADDER TUMOR N/A 03/28/2022   Procedure: TRANSURETHRAL RESECTION OF BLADDER TUMOR (TURBT);  Surgeon: Rodney Gauze, Moreno;  Location: [facility];  Service: Urology;  Laterality: N/A;      Current Outpatient Medications:    acetaminophen-codeine (TYLENOL #4) 300-60 MG tablet, Take by mouth., Disp: , Rfl:    allopurinol (ZYLOPRIM) 300 MG tablet, Take 300 mg by mouth daily., Disp: , Rfl:    aspirin EC 81 MG tablet, Take 81 mg by mouth daily. Swallow whole., Disp: , Rfl:    cloNIDine (CATAPRES) 0.1 MG  tablet, Take 0.1 mg by mouth 2 (two) times daily as needed., Disp: , Rfl:    Evolocumab (REPATHA SURECLICK) 140 MG/ML SOAJ, Inject 140 mg into the skin every 14 (fourteen) days., Disp: 6 mL, Rfl: 3   losartan (COZAAR) 50 MG tablet, Take 50 mg by mouth daily., Disp: , Rfl:    oxyCODONE-acetaminophen (PERCOCET) 5-325 MG tablet, Take 1 tablet by mouth every 4 (four) hours as needed for severe pain., Disp: 30 tablet, Rfl: 0    Physical Exam: Blood pressure (!) 136/94, pulse 88, height 5\' 10"  (1.778 m), weight 283 lb 9.6 oz (128.6 kg), SpO2 97 %.    Affect appropriate Healthy:  appears stated age HEENT: normal Neck supple with no adenopathy JVP normal no bruits no thyromegaly Lungs clear with no wheezing and good diaphragmatic motion Heart:  S1/S2 no murmur, no rub, gallop or click PMI normal Abdomen: benighn, BS positve, no tenderness, no AAA no bruit.  No HSM or HJR Distal pulses intact with no bruits No edema Neuro non-focal Skin warm and dry No muscular weakness   Labs:   Lab Results  Component Value Date   WBC 7.3 06/23/2008   HGB 14.5 06/23/2008   HCT 42.3 06/23/2008   MCV 92.4 06/23/2008   PLT 232 06/23/2008   No results for input(s): "NA", "K", "CL", "CO2", "BUN", "CREATININE", "CALCIUM", "PROT", "BILITOT", "ALKPHOS", "ALT", "AST", "GLUCOSE" in the last 168 hours.  Invalid input(s): "LABALBU" No results found for: "CKTOTAL", "CKMB", "CKMBINDEX", "TROPONINI" No results found for: "CHOL" No results found for: "HDL" No results found for: "LDLCALC" No results found for: "TRIG" No results found for: "CHOLHDL" No results found for: "LDLDIRECT"    Radiology: No results found.  EKG: SR LBBB rate 86 03/04/22   ASSESSMENT AND PLAN:   HTN:  Change to Diovan/HCTZ and norvasc f/u PA 6-8 weeks  HLD:  started on Repatha 08/24/22 needs repeat lipid / liver  Aortic Aneurysm:  4.5 cm by recent CTA F/U scan scheduled for 05/2023  refer to CVTS   Thyroid:  f/u primary for  TSH Bladder Cancer:  Continue f/u [facility] BCG RX Cystoscopy due soon  OSA:  CPAP weight loss  CAD:  high calcium score no obstructive CAD by cath in [facility] 2022    Lipid/Liver BMET CTA aorta 05/2023  Refer CVTS   F/U in a year with me   Signed: Charlton Haws 12/19/2022, 8:16 AM

## 2022-12-06 ENCOUNTER — Telehealth: Payer: Self-pay

## 2022-12-06 ENCOUNTER — Other Ambulatory Visit (HOSPITAL_COMMUNITY): Payer: Self-pay

## 2022-12-06 NOTE — Telephone Encounter (Signed)
Pharmacy Patient Advocate Encounter   Received notification from [facility] that prior authorization for REPATHA is needed.    PA submitted on 12/06/22 Key [id number] Status is pending  Haze Rushing, CPhT Pharmacy Patient Advocate Specialist Direct Number: [phone] Fax: [phone]

## 2022-12-06 NOTE — Telephone Encounter (Signed)
Pharmacy Patient Advocate Encounter  Prior Authorization for Francine Graven has been approved.    Effective dates: 09/28/22 through 08/29/23  Haze Rushing, CPhT Pharmacy Patient Advocate Specialist Direct Number: [phone] Fax: [phone]

## 2022-12-07 MED ORDER — REPATHA SURECLICK 140 MG/ML ~~LOC~~ SOAJ: 140.0000 mg | SUBCUTANEOUS | 3 refills | Status: DC

## 2022-12-07 NOTE — Addendum Note (Signed)
Addended by: Tiyonna Sardinha E on: 12/07/2022 09:17 AM   Modules accepted: Orders

## 2022-12-12 DIAGNOSIS — Z1283 Encounter for screening for malignant neoplasm of skin: Secondary | ICD-10-CM | POA: Diagnosis not present

## 2022-12-12 DIAGNOSIS — C4371 Malignant melanoma of right lower limb, including hip: Secondary | ICD-10-CM | POA: Diagnosis not present

## 2022-12-12 DIAGNOSIS — L570 Actinic keratosis: Secondary | ICD-10-CM | POA: Diagnosis not present

## 2022-12-12 DIAGNOSIS — D225 Melanocytic nevi of trunk: Secondary | ICD-10-CM | POA: Diagnosis not present

## 2022-12-12 DIAGNOSIS — L820 Inflamed seborrheic keratosis: Secondary | ICD-10-CM | POA: Diagnosis not present

## 2022-12-12 DIAGNOSIS — X32XXXD Exposure to sunlight, subsequent encounter: Secondary | ICD-10-CM | POA: Diagnosis not present

## 2022-12-16 ENCOUNTER — Other Ambulatory Visit: Payer: 59

## 2022-12-16 ENCOUNTER — Ambulatory Visit: Payer: 59 | Admitting: Thoracic Surgery (Cardiothoracic Vascular Surgery)

## 2022-12-19 ENCOUNTER — Ambulatory Visit: Payer: Medicare HMO | Attending: Cardiovascular Disease | Admitting: Cardiovascular Disease

## 2022-12-19 ENCOUNTER — Encounter: Payer: Self-pay | Admitting: Cardiovascular Disease

## 2022-12-19 VITALS — BP 136/94 | HR 88 | Ht 70.0 in | Wt 283.6 lb

## 2022-12-19 DIAGNOSIS — I719 Aortic aneurysm of unspecified site, without rupture: Secondary | ICD-10-CM | POA: Diagnosis not present

## 2022-12-19 DIAGNOSIS — E782 Mixed hyperlipidemia: Secondary | ICD-10-CM

## 2022-12-19 DIAGNOSIS — I10 Essential (primary) hypertension: Secondary | ICD-10-CM | POA: Diagnosis not present

## 2022-12-19 DIAGNOSIS — I712 Thoracic aortic aneurysm, without rupture, unspecified: Secondary | ICD-10-CM | POA: Diagnosis not present

## 2022-12-19 DIAGNOSIS — E785 Hyperlipidemia, unspecified: Secondary | ICD-10-CM

## 2022-12-19 DIAGNOSIS — Z125 Encounter for screening for malignant neoplasm of prostate: Secondary | ICD-10-CM | POA: Diagnosis not present

## 2022-12-19 DIAGNOSIS — I700 Atherosclerosis of aorta: Secondary | ICD-10-CM | POA: Diagnosis not present

## 2022-12-19 DIAGNOSIS — Z6841 Body Mass Index (BMI) 40.0 and over, adult: Secondary | ICD-10-CM | POA: Diagnosis not present

## 2022-12-19 DIAGNOSIS — Z0001 Encounter for general adult medical examination with abnormal findings: Secondary | ICD-10-CM | POA: Diagnosis not present

## 2022-12-19 DIAGNOSIS — I7121 Aneurysm of the ascending aorta, without rupture: Secondary | ICD-10-CM

## 2022-12-19 DIAGNOSIS — C679 Malignant neoplasm of bladder, unspecified: Secondary | ICD-10-CM | POA: Diagnosis not present

## 2022-12-19 MED ORDER — VALSARTAN-HYDROCHLOROTHIAZIDE 320-12.5 MG PO TABS: 1.0000 | ORAL_TABLET | Freq: Every day | ORAL | 3 refills | Status: DC

## 2022-12-19 MED ORDER — AMLODIPINE BESYLATE 5 MG PO TABS: 5.0000 mg | ORAL_TABLET | Freq: Every day | ORAL | 3 refills | Status: DC

## 2022-12-19 NOTE — Patient Instructions (Addendum)
Medication Instructions:  Your physician recommends that you continue on your current medications as directed. Please refer to the Current Medication list given to you today.  Stop Taking Clonidine  Stop Taking Losartan   Start taking Diovan-HCT 320-12.5 mg Daily  Start Norvasc 5 mg Daily   *If you need a refill on your cardiac medications before your next appointment, please call your pharmacy*   Lab Work: Your physician recommends that you return for lab work fasting.   If you have labs (blood work) drawn today and your tests are completely normal, you will receive your results only by: MyChart Message (if you have MyChart) OR A paper copy in the mail If you have any lab test that is abnormal or we need to change your treatment, we will call you to review the results.   Testing/Procedures: NONE    Follow-Up: At [facility], you and your health needs are our priority.  As part of our continuing mission to provide you with exceptional heart care, we have created designated Provider Care Teams.  These Care Teams include your primary Cardiologist (physician) and Advanced Practice Providers (APPs -  Physician Assistants and Nurse Practitioners) who all work together to provide you with the care you need, when you need it.  We recommend signing up for the patient portal called "MyChart".  Sign up information is provided on this After Visit Summary.  MyChart is used to connect with patients for Virtual Visits (Telemedicine).  Patients are able to view lab/test results, encounter notes, upcoming appointments, etc.  Non-urgent messages can be sent to your provider as well.   To learn more about what you can do with MyChart, go to [email or web address].    Your next appointment:   1 year(s)  Provider:   You may see Charlton Haws, MD or one of the following Advanced Practice Providers on your designated Care Team:   Randall An, PA-C  Jacolyn Reedy, PA-C     Other  Instructions Thank you for choosing [facility]!

## 2022-12-20 ENCOUNTER — Other Ambulatory Visit (HOSPITAL_COMMUNITY)
Admission: RE | Admit: 2022-12-20 | Discharge: 2022-12-20 | Disposition: A | Discharge status: Home / Self Care | Payer: Medicare HMO | Source: Ambulatory Visit | Attending: Cardiovascular Disease | Admitting: Cardiovascular Disease

## 2022-12-20 ENCOUNTER — Telehealth: Payer: Self-pay

## 2022-12-20 DIAGNOSIS — E782 Mixed hyperlipidemia: Secondary | ICD-10-CM | POA: Diagnosis not present

## 2022-12-20 LAB — HEPATIC FUNCTION PANEL
ALT: 27 U/L (ref 0–44)
AST: 28 U/L (ref 15–41)
Albumin: 4.1 g/dL (ref 3.5–5.0)
Alkaline Phosphatase: 65 U/L (ref 38–126)
Bilirubin, Direct: 0.2 mg/dL (ref 0.0–0.2)
Indirect Bilirubin: 0.7 mg/dL (ref 0.3–0.9)
Total Bilirubin: 0.9 mg/dL (ref 0.3–1.2)
Total Protein: 7.1 g/dL (ref 6.5–8.1)

## 2022-12-20 LAB — LIPID PANEL
Cholesterol: 86 mg/dL (ref 0–200)
HDL: 46 mg/dL (ref >40)
LDL Cholesterol: 24 mg/dL (ref 0–99)
Total CHOL/HDL Ratio: 1.9 RATIO
Triglycerides: 80 mg/dL (ref <150)
VLDL: 16 mg/dL (ref 0–40)

## 2022-12-20 NOTE — Telephone Encounter (Signed)
Patient notified and verbalized understanding. Patient had no questions or concerns at this time. PCP copied 

## 2022-12-20 NOTE — Telephone Encounter (Signed)
-----   Message from Peter C Nishan, MD sent at 12/20/2022 11:01 AM EDT ----- Cholesterol is at goal and LFT's are normal F/U labs in 6 months 

## 2022-12-29 DIAGNOSIS — I10 Essential (primary) hypertension: Secondary | ICD-10-CM | POA: Diagnosis not present

## 2022-12-30 ENCOUNTER — Other Ambulatory Visit (HOSPITAL_COMMUNITY): Payer: Self-pay | Admitting: Internal Medicine

## 2022-12-30 DIAGNOSIS — I719 Aortic aneurysm of unspecified site, without rupture: Secondary | ICD-10-CM

## 2023-01-02 DIAGNOSIS — C4371 Malignant melanoma of right lower limb, including hip: Secondary | ICD-10-CM | POA: Diagnosis not present

## 2023-01-11 ENCOUNTER — Ambulatory Visit (HOSPITAL_COMMUNITY)
Admission: RE | Admit: 2023-01-11 | Discharge: 2023-01-11 | Disposition: A | Discharge status: Home / Self Care | Payer: Medicare HMO | Source: Ambulatory Visit | Attending: Internal Medicine | Admitting: Internal Medicine

## 2023-01-11 DIAGNOSIS — Z0389 Encounter for observation for other suspected diseases and conditions ruled out: Secondary | ICD-10-CM | POA: Diagnosis not present

## 2023-01-11 DIAGNOSIS — Z136 Encounter for screening for cardiovascular disorders: Secondary | ICD-10-CM | POA: Diagnosis not present

## 2023-01-11 DIAGNOSIS — I712 Thoracic aortic aneurysm, without rupture, unspecified: Secondary | ICD-10-CM | POA: Insufficient documentation

## 2023-01-11 DIAGNOSIS — I719 Aortic aneurysm of unspecified site, without rupture: Secondary | ICD-10-CM | POA: Insufficient documentation

## 2023-01-13 ENCOUNTER — Other Ambulatory Visit (HOSPITAL_COMMUNITY): Payer: Self-pay | Admitting: Internal Medicine

## 2023-01-13 DIAGNOSIS — Z136 Encounter for screening for cardiovascular disorders: Secondary | ICD-10-CM

## 2023-01-25 ENCOUNTER — Ambulatory Visit: Payer: Medicare HMO | Admitting: Urology

## 2023-01-25 VITALS — BP 147/78 | HR 98

## 2023-01-25 DIAGNOSIS — N329 Bladder disorder, unspecified: Secondary | ICD-10-CM | POA: Diagnosis not present

## 2023-01-25 DIAGNOSIS — Z8551 Personal history of malignant neoplasm of bladder: Secondary | ICD-10-CM

## 2023-01-25 DIAGNOSIS — D494 Neoplasm of unspecified behavior of bladder: Secondary | ICD-10-CM

## 2023-01-25 LAB — URINALYSIS, ROUTINE W REFLEX MICROSCOPIC
Bilirubin, UA: NEGATIVE
Glucose, UA: NEGATIVE
Ketones, UA: NEGATIVE
Leukocytes,UA: NEGATIVE
Nitrite, UA: NEGATIVE
RBC, UA: NEGATIVE
Specific Gravity, UA: 1.03 (ref 1.005–1.030)
Urobilinogen, Ur: 1 mg/dL (ref 0.2–1.0)
pH, UA: 5.5 (ref 5.0–7.5)

## 2023-01-25 LAB — MICROSCOPIC EXAMINATION: Bacteria, UA: NONE SEEN

## 2023-01-25 MED ORDER — CIPROFLOXACIN HCL 500 MG PO TABS
500.0000 mg | ORAL_TABLET | Freq: Once | ORAL | Status: AC
Start: 2023-01-25 — End: 2023-01-25
  Administered 2023-01-25: 500 mg via ORAL

## 2023-01-25 NOTE — Progress Notes (Unsigned)
   01/25/23  CC: followup bladder cancer   HPI:  Blood pressure (!) 147/78, pulse 98. NED. A&Ox3.   No respiratory distress   Abd soft, NT, ND Normal phallus with bilateral descended testicles  Cystoscopy Procedure Note  Patient identification was confirmed, informed consent was obtained, and patient was prepped using Betadine solution.  Lidocaine jelly was administered per urethral meatus.     Pre-Procedure: - Inspection reveals a normal caliber ureteral meatus.  Procedure: The flexible cystoscope was introduced without difficulty - No urethral strictures/lesions are present. - Enlarged prostate  - Normal bladder neck - Bilateral ureteral orifices identified - Bladder mucosa  reveals no ulcers, tumors, or lesions - No bladder stones - No trabeculation   Post-Procedure: - Patient tolerated the procedure well  Assessment/ Plan: Followup 3 months for cystoscopy  No follow-ups on file.  Wilkie Aye, MD

## 2023-01-26 ENCOUNTER — Ambulatory Visit: Payer: No Typology Code available for payment source | Attending: Student | Admitting: Student

## 2023-01-26 ENCOUNTER — Encounter: Payer: Self-pay | Admitting: Student

## 2023-01-26 ENCOUNTER — Encounter: Payer: Self-pay | Admitting: Urology

## 2023-01-26 VITALS — BP 126/70 | HR 86 | Ht 70.0 in | Wt 288.0 lb

## 2023-01-26 DIAGNOSIS — G4733 Obstructive sleep apnea (adult) (pediatric): Secondary | ICD-10-CM

## 2023-01-26 DIAGNOSIS — I10 Essential (primary) hypertension: Secondary | ICD-10-CM

## 2023-01-26 DIAGNOSIS — E785 Hyperlipidemia, unspecified: Secondary | ICD-10-CM

## 2023-01-26 DIAGNOSIS — I2510 Atherosclerotic heart disease of native coronary artery without angina pectoris: Secondary | ICD-10-CM

## 2023-01-26 DIAGNOSIS — Z79899 Other long term (current) drug therapy: Secondary | ICD-10-CM

## 2023-01-26 DIAGNOSIS — I7121 Aneurysm of the ascending aorta, without rupture: Secondary | ICD-10-CM

## 2023-01-26 NOTE — Progress Notes (Signed)
   Cardiology Office Note    Date:  01/26/2023  ID:  Rodney Moreno, DOB September 15, 1950, MRN [id number] Cardiologist: Charlton Haws, MD    History of Present Illness:    Rodney Moreno is a 72 y.o. male with past medical history of ascending aortic aneurysm (at 4.5 cm by CT in 05/2022), CAD (nonobstructive disease by prior catheterization in 2022), HTN, HLD, OSA and history of bladder cancer who presents to the office today for 6-week follow-up.  He was last examined by Dr. Eden Emms in 11/2022 and had been evaluated in the ED for elevated blood pressure but left without being seen. He was using as needed Clonidine at the time of his office visit and given that this was not ideal, he was switched from Losartan to Valsartan-HCTZ 320-12.5 mg daily and also started on Amlodipine 5 mg daily with follow-up recommended for reassessment.  In talking with the patient today, he reports overall feeling well since his last office visit. He did not experience any significant side effects with the medication adjustments made at that time.  Says his blood pressure has significantly improved and his systolic readings are typically in the 120's. Were previously as high as the 180's. He denies any chest pain or dyspnea on exertion. No specific palpitations, orthopnea, PND or pitting edema. He does use his CPAP at night. He is planning to travel to [place] later this summer and will be there for several months.    Studies Reviewed:   EKG: EKG is not ordered today.  Echocardiogram: 06/2021    Physical Exam:   VS:  BP 126/70   Pulse 86   Ht 5\' 10"  (1.778 m)   Wt 288 lb (130.6 kg)   SpO2 95%   BMI 41.32 kg/m    Wt Readings from Last 3 Encounters:  01/26/23 288 lb (130.6 kg)  12/19/22 283 lb 9.6 oz (128.6 kg)  10/05/22 277 lb (125.6 kg)     GEN: Pleasant male appearing in no acute distress NECK: No JVD; No carotid bruits CARDIAC: RRR, no murmurs, rubs, gallops RESPIRATORY:  Clear to auscultation without rales,  wheezing or rhonchi  ABDOMEN: Appears non-distended. No obvious abdominal masses. EXTREMITIES: No clubbing or cyanosis. No pitting edema.  Distal pedal pulses are 2+ bilaterally.   Assessment and Plan:   1. HTN - His blood pressure has significantly improved and is well-controlled at 126/70 during today's visit has also been well-controlled when checked at home. Will continue current medical therapy for now with Amlodipine 5 mg daily and Valsartan-HCTZ 320-12.5 mg daily. Will recheck a BMET for reassessment of his electrolytes and renal function following recent medication adjustments.  2. CAD - By review of records, he had nonobstructive disease by prior catheterization in 2022. He remains active at baseline and denies any recent anginal symptoms. Continue with ASA 81 mg daily and Repatha.  3. Ascending Aortic Aneurysm - This measured 4.5 cm by CT in 05/2022. Will make sure follow-up imaging is scheduled for 05/2023 as orders were entered at the time of his last visit but not yet scheduled.   4. HLD - FLP in 11/2022 showed total cholesterol 86, triglycerides 80, HDL 46 and LDL 24. Continue with Repatha.  5. OSA - Continued compliance with CPAP encouraged.   Signed, Ellsworth Lennox, PA-C

## 2023-01-26 NOTE — Patient Instructions (Signed)
Medication Instructions:  Your physician recommends that you continue on your current medications as directed. Please refer to the Current Medication list given to you today.   Labwork: BMET at [facility]  Testing/Procedures: Chest CT/aorta in October  Follow-Up: 1 year Dr.Nishan  Any Other Special Instructions Will Be Listed Below (If Applicable).  If you need a refill on your cardiac medications before your next appointment, please call your pharmacy.

## 2023-01-26 NOTE — Patient Instructions (Signed)

## 2023-01-30 DIAGNOSIS — Z79899 Other long term (current) drug therapy: Secondary | ICD-10-CM | POA: Diagnosis not present

## 2023-01-31 ENCOUNTER — Ambulatory Visit (HOSPITAL_COMMUNITY)
Admission: RE | Admit: 2023-01-31 | Discharge: 2023-01-31 | Disposition: A | Discharge status: Home / Self Care | Payer: Medicare HMO | Source: Ambulatory Visit | Attending: Internal Medicine | Admitting: Internal Medicine

## 2023-01-31 DIAGNOSIS — Z136 Encounter for screening for cardiovascular disorders: Secondary | ICD-10-CM | POA: Insufficient documentation

## 2023-01-31 LAB — BASIC METABOLIC PANEL
BUN/Creatinine Ratio: 23 (ref 10–24)
BUN: 21 mg/dL (ref 8–27)
CO2: 24 mmol/L (ref 20–29)
Calcium: 10.1 mg/dL (ref 8.6–10.2)
Chloride: 105 mmol/L (ref 96–106)
Creatinine, Ser: 0.91 mg/dL (ref 0.76–1.27)
Glucose: 118 mg/dL — ABNORMAL HIGH (ref 70–99)
Potassium: 4.9 mmol/L (ref 3.5–5.2)
Sodium: 144 mmol/L (ref 134–144)
eGFR: 90 mL/min/{1.73_m2} (ref >59)

## 2023-02-08 ENCOUNTER — Other Ambulatory Visit: Payer: 59 | Admitting: Urology

## 2023-05-08 DIAGNOSIS — Z08 Encounter for follow-up examination after completed treatment for malignant neoplasm: Secondary | ICD-10-CM | POA: Diagnosis not present

## 2023-05-08 DIAGNOSIS — X32XXXD Exposure to sunlight, subsequent encounter: Secondary | ICD-10-CM | POA: Diagnosis not present

## 2023-05-08 DIAGNOSIS — C44219 Basal cell carcinoma of skin of left ear and external auricular canal: Secondary | ICD-10-CM | POA: Diagnosis not present

## 2023-05-08 DIAGNOSIS — D225 Melanocytic nevi of trunk: Secondary | ICD-10-CM | POA: Diagnosis not present

## 2023-05-08 DIAGNOSIS — Z85820 Personal history of malignant melanoma of skin: Secondary | ICD-10-CM | POA: Diagnosis not present

## 2023-05-08 DIAGNOSIS — L570 Actinic keratosis: Secondary | ICD-10-CM | POA: Diagnosis not present

## 2023-05-08 DIAGNOSIS — Z1283 Encounter for screening for malignant neoplasm of skin: Secondary | ICD-10-CM | POA: Diagnosis not present

## 2023-05-08 DIAGNOSIS — C44311 Basal cell carcinoma of skin of nose: Secondary | ICD-10-CM | POA: Diagnosis not present

## 2023-05-08 DIAGNOSIS — D0471 Carcinoma in situ of skin of right lower limb, including hip: Secondary | ICD-10-CM | POA: Diagnosis not present

## 2023-05-11 ENCOUNTER — Other Ambulatory Visit: Payer: Self-pay

## 2023-05-11 DIAGNOSIS — E785 Hyperlipidemia, unspecified: Secondary | ICD-10-CM

## 2023-05-22 ENCOUNTER — Ambulatory Visit (INDEPENDENT_AMBULATORY_CARE_PROVIDER_SITE_OTHER): Payer: No Typology Code available for payment source | Admitting: Urology

## 2023-05-22 VITALS — BP 118/69 | HR 105

## 2023-05-22 DIAGNOSIS — D494 Neoplasm of unspecified behavior of bladder: Secondary | ICD-10-CM

## 2023-05-22 DIAGNOSIS — Z8551 Personal history of malignant neoplasm of bladder: Secondary | ICD-10-CM | POA: Diagnosis not present

## 2023-05-22 LAB — URINALYSIS, ROUTINE W REFLEX MICROSCOPIC
Bilirubin, UA: NEGATIVE
Glucose, UA: NEGATIVE
Ketones, UA: NEGATIVE
Leukocytes,UA: NEGATIVE
Nitrite, UA: NEGATIVE
RBC, UA: NEGATIVE
Specific Gravity, UA: 1.03 (ref 1.005–1.030)
Urobilinogen, Ur: 0.2 mg/dL (ref 0.2–1.0)
pH, UA: 6 (ref 5.0–7.5)

## 2023-05-22 LAB — MICROSCOPIC EXAMINATION: Bacteria, UA: NONE SEEN

## 2023-05-22 MED ORDER — CIPROFLOXACIN HCL 500 MG PO TABS
500.0000 mg | ORAL_TABLET | Freq: Once | ORAL | Status: AC
Start: 2023-05-22 — End: 2023-05-22
  Administered 2023-05-22: 500 mg via ORAL

## 2023-05-22 NOTE — Progress Notes (Unsigned)
   05/22/23  CC: followup bladder cancer   HPI: Rodney Moreno is a 72yo here for followup for bladder cancer Blood pressure 118/69, pulse (!) 105. NED. A&Ox3.   No respiratory distress   Abd soft, NT, ND Normal phallus with bilateral descended testicles  Cystoscopy Procedure Note  Patient identification was confirmed, informed consent was obtained, and patient was prepped using Betadine solution.  Lidocaine jelly was administered per urethral meatus.     Pre-Procedure: - Inspection reveals a normal caliber ureteral meatus.  Procedure: The flexible cystoscope was introduced without difficulty - No urethral strictures/lesions are present. - Enlarged prostate  - Normal bladder neck - Bilateral ureteral orifices identified - Bladder mucosa  reveals no ulcers, tumors, or lesions - No bladder stones - No trabeculation     Post-Procedure: - Patient tolerated the procedure well  Assessment/ Plan: Followup 3 months for cystoscopy  No follow-ups on file.  Wilkie Aye, MD

## 2023-05-23 ENCOUNTER — Encounter: Payer: Self-pay | Admitting: Urology

## 2023-05-23 NOTE — Patient Instructions (Signed)

## 2023-05-25 DIAGNOSIS — I872 Venous insufficiency (chronic) (peripheral): Secondary | ICD-10-CM | POA: Diagnosis not present

## 2023-05-25 DIAGNOSIS — C4371 Malignant melanoma of right lower limb, including hip: Secondary | ICD-10-CM | POA: Diagnosis not present

## 2023-05-25 DIAGNOSIS — L989 Disorder of the skin and subcutaneous tissue, unspecified: Secondary | ICD-10-CM | POA: Diagnosis not present

## 2023-05-30 DIAGNOSIS — E042 Nontoxic multinodular goiter: Secondary | ICD-10-CM | POA: Diagnosis not present

## 2023-06-01 ENCOUNTER — Ambulatory Visit (HOSPITAL_COMMUNITY)
Admission: RE | Admit: 2023-06-01 | Discharge: 2023-06-01 | Disposition: A | Discharge status: Home / Self Care | Payer: Medicare HMO | Source: Ambulatory Visit | Attending: Cardiovascular Disease | Admitting: Cardiovascular Disease

## 2023-06-01 DIAGNOSIS — I7121 Aneurysm of the ascending aorta, without rupture: Secondary | ICD-10-CM | POA: Diagnosis not present

## 2023-06-01 MED ORDER — IOHEXOL 300 MG/ML  SOLN
100.0000 mL | Freq: Once | INTRAMUSCULAR | Status: AC | PRN
  Administered 2023-06-01: 100 mL via INTRAVENOUS

## 2023-06-05 DIAGNOSIS — E042 Nontoxic multinodular goiter: Secondary | ICD-10-CM | POA: Diagnosis not present

## 2023-06-05 LAB — POCT I-STAT CREATININE: Creatinine, Ser: 0.9 mg/dL (ref 0.61–1.24)

## 2023-06-06 ENCOUNTER — Other Ambulatory Visit (HOSPITAL_COMMUNITY): Payer: Self-pay | Admitting: Nurse Practitioner

## 2023-06-06 DIAGNOSIS — E042 Nontoxic multinodular goiter: Secondary | ICD-10-CM

## 2023-06-07 ENCOUNTER — Telehealth: Payer: Self-pay

## 2023-06-07 DIAGNOSIS — I7121 Aneurysm of the ascending aorta, without rupture: Secondary | ICD-10-CM

## 2023-06-07 NOTE — Telephone Encounter (Signed)
-----   Message from Charlton Haws sent at 06/07/2023  8:18 AM EDT ----- Aneurysm 4.5-4.9 cm still not likely surgical but should refer him to CVTS Dr Dorris Fetch

## 2023-06-07 NOTE — Telephone Encounter (Signed)
Results discussed with patient,will refer to Dr.Hendrickson,pcp copied

## 2023-06-09 ENCOUNTER — Ambulatory Visit (HOSPITAL_COMMUNITY)
Admission: RE | Admit: 2023-06-09 | Discharge: 2023-06-09 | Disposition: A | Discharge status: Home / Self Care | Payer: Medicare HMO | Source: Ambulatory Visit | Attending: Nurse Practitioner | Admitting: Nurse Practitioner

## 2023-06-09 DIAGNOSIS — E042 Nontoxic multinodular goiter: Secondary | ICD-10-CM | POA: Insufficient documentation

## 2023-07-03 IMAGING — US US THYROID
1 series · 12 of 25 positions shown · non-contrast
Comparison: None.

CLINICAL DATA: Nontoxic single thyroid nodule

EXAM:
THYROID ULTRASOUND
TECHNIQUE: Ultrasound examination of the thyroid gland and adjacent soft
tissues was performed.

[Series 1: us thyroid · 0.07mm/px · 12 of 119 slices shown]
[im 5/119]
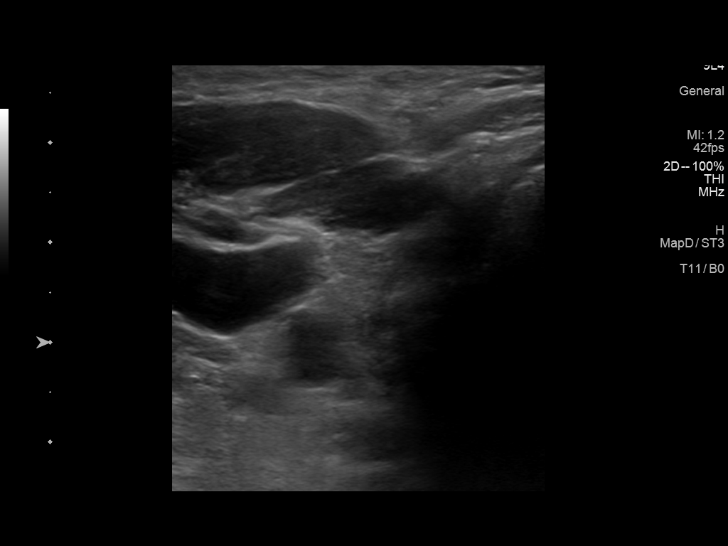
[im 15/119]
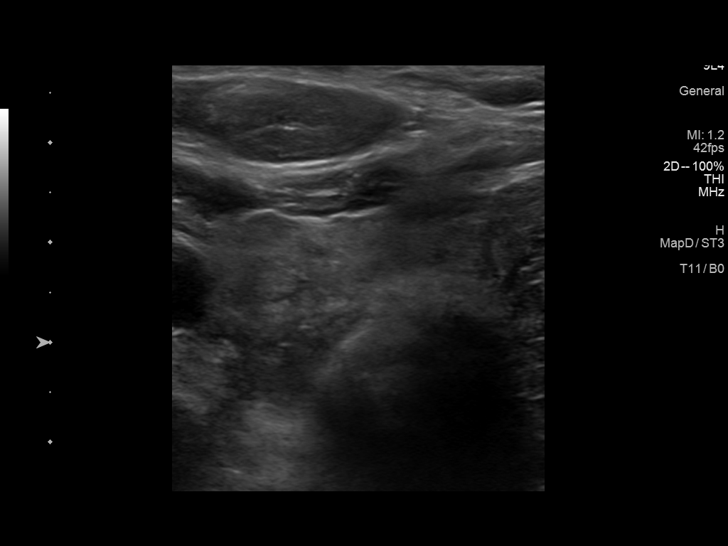
[im 25/119]
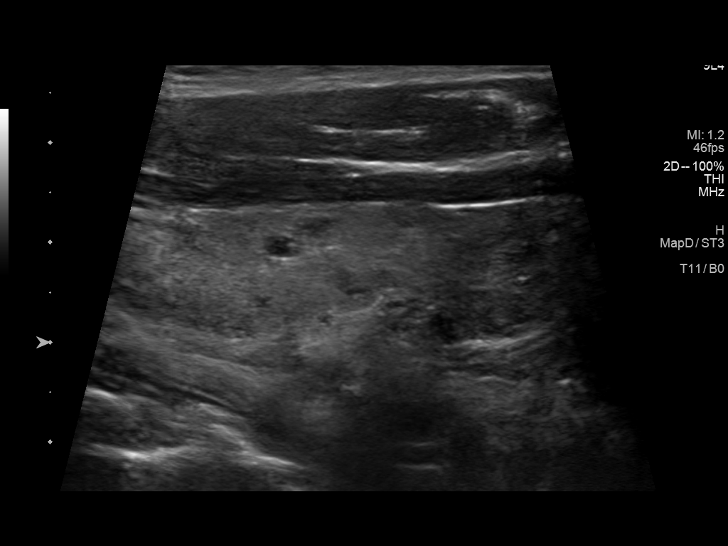
[im 35/119]
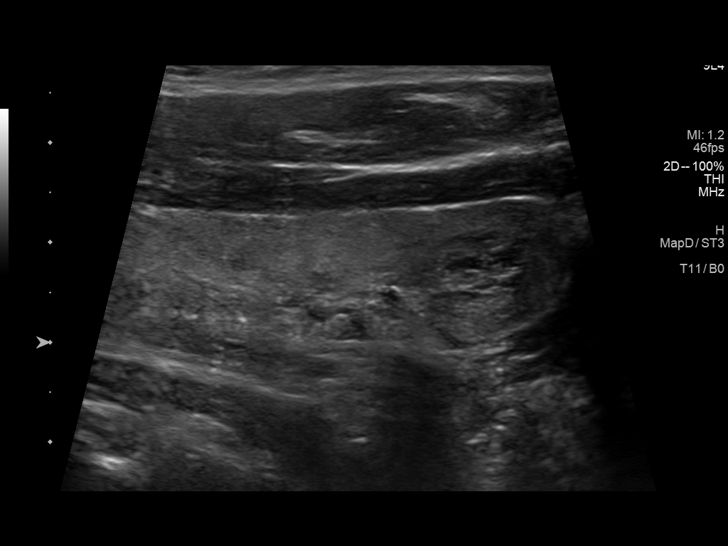
[im 45/119]
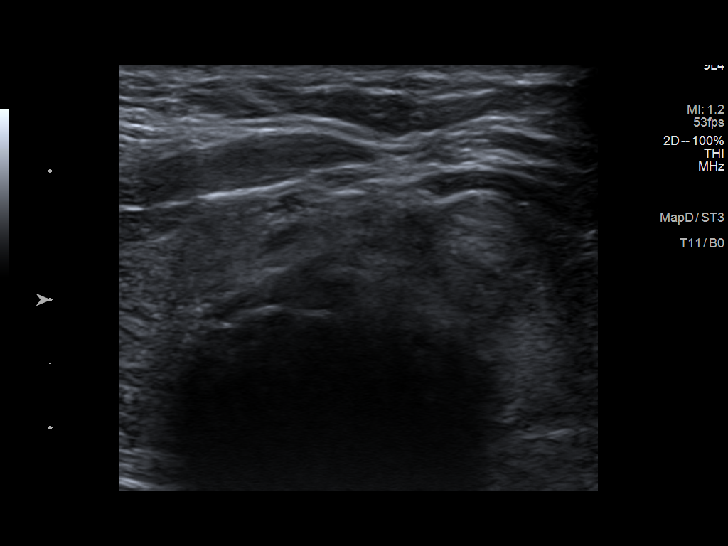
[im 55/119]
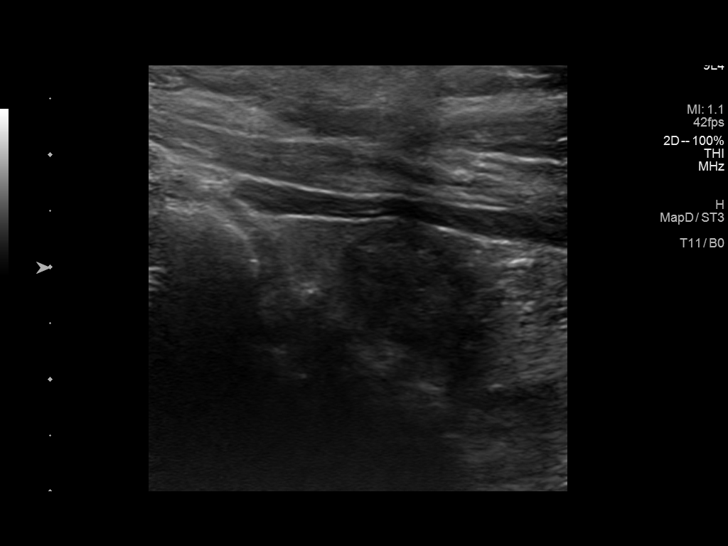
[im 64/119]
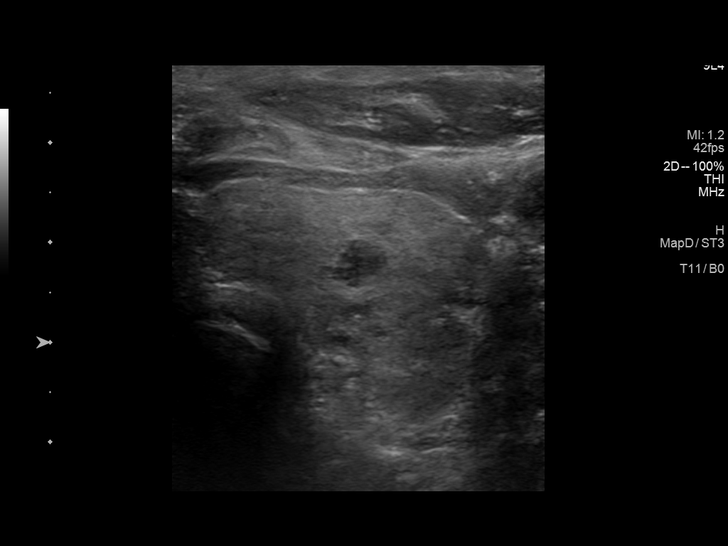
[im 74/119]
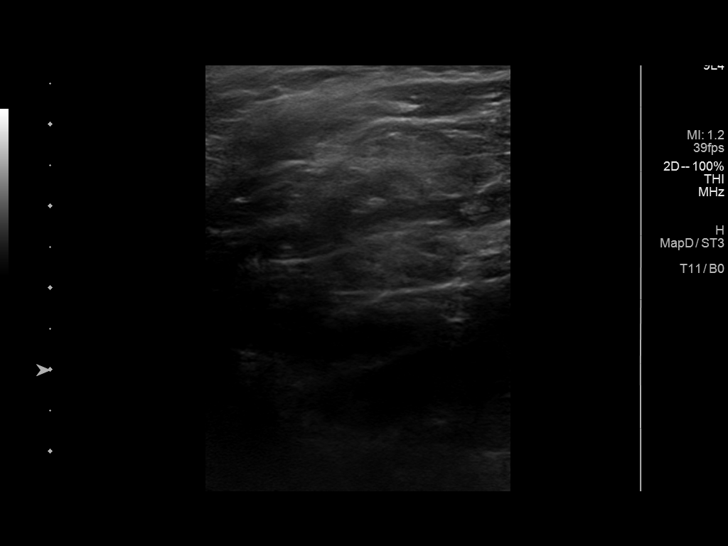
[im 84/119]
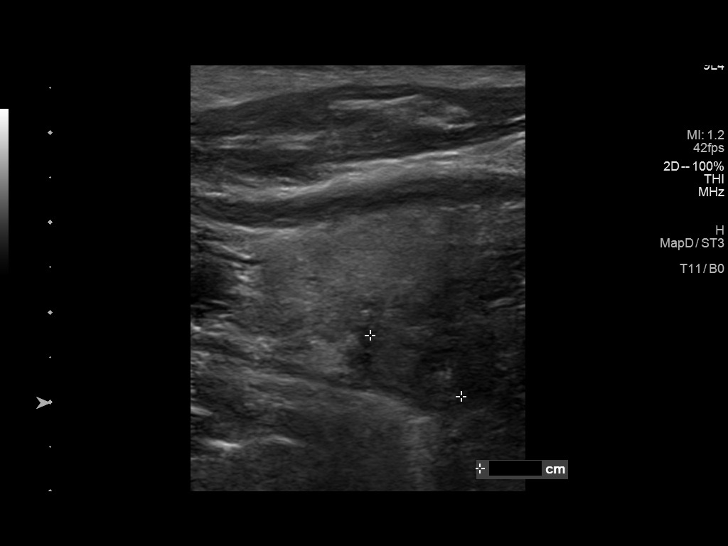
[im 94/119]
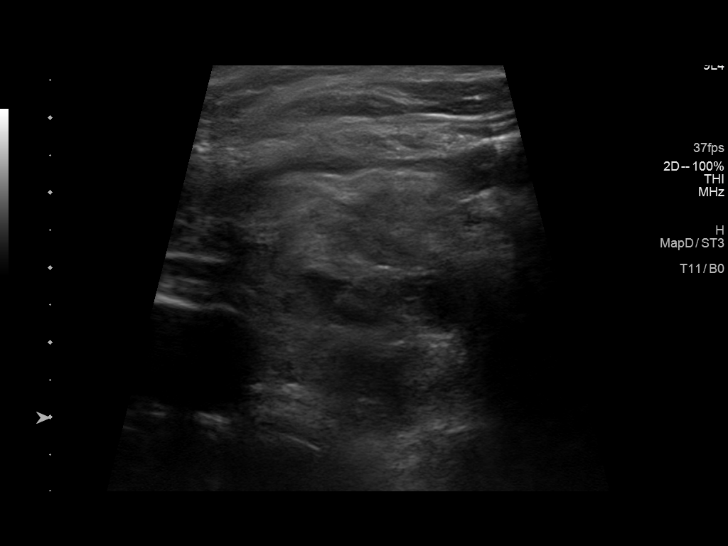
[im 104/119]
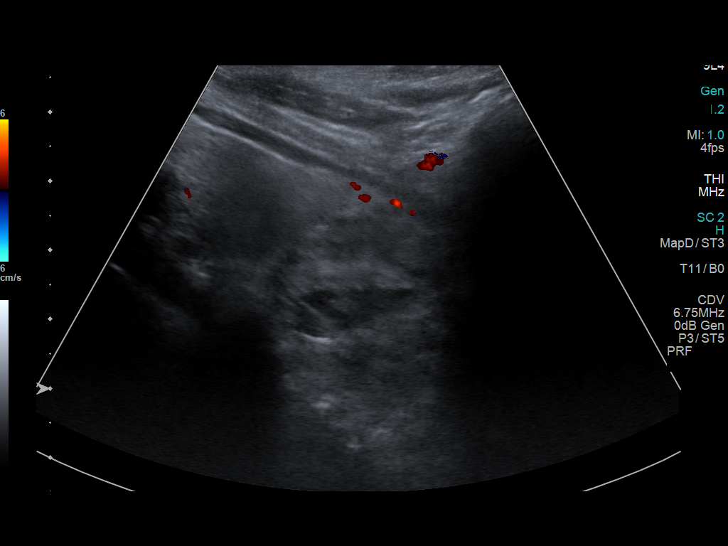
[im 114/119]
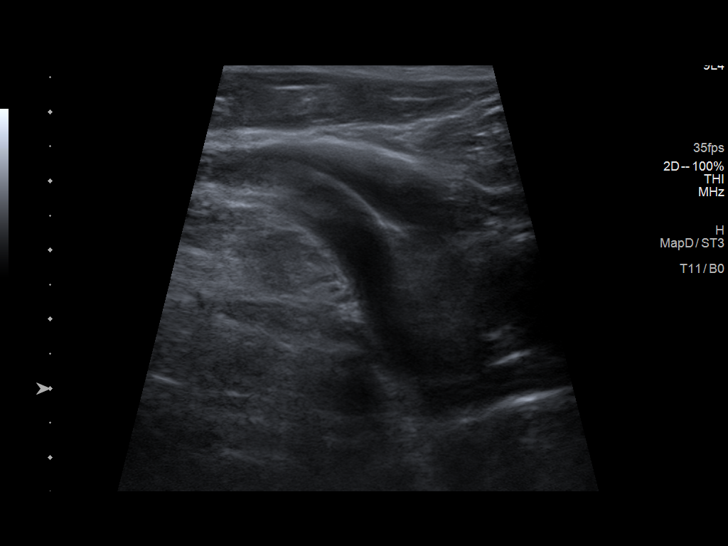

[12 of 25 positions shown; findings below may reference images not displayed]

FINDINGS: Parenchymal Echotexture: Moderately heterogeneous

Isthmus: 0.6 cm

Right lobe: 4.5 x 1.4 x 1.8 cm

Left lobe: 6.3 x 2.6 x 2.1 cm

_________________________________________________________

Estimated total number of nodules >/= 1 cm: 4

Number of spongiform nodules >/=  2 cm not described below (TR1): 0

Number of mixed cystic and solid nodules >/= 1.5 cm not described
below (TR2): 0

_________________________________________________________

Nodule # 1:

Location: Isthmus; inferior

Maximum size: 1.2 cm; Other 2 dimensions: 1.1 x 1.0 cm

Composition: solid/almost completely solid (2)

Echogenicity: hypoechoic (2)

Shape: not taller-than-wide (0)

Margins: smooth (0)

Echogenic foci: none (0)

ACR TI-RADS total points: 4.

ACR TI-RADS risk category: TR4 (4-6 points).

ACR TI-RADS recommendations:

*Given size (>/= 1 - 1.4 cm) and appearance, a follow-up ultrasound
in 1 year should be considered based on TI-RADS criteria.

_________________________________________________________

Nodule # 2:

Location: Right; inferior

Maximum size: 1.1 cm; Other 2 dimensions: 0.9 x 0.9 cm

Composition: solid/almost completely solid (2)

Echogenicity: hypoechoic (2)

Shape: not taller-than-wide (0)

Margins: smooth (0)

Echogenic foci: none (0)

ACR TI-RADS total points: 4.

ACR TI-RADS risk category: TR4 (4-6 points).

ACR TI-RADS recommendations:

*Given size (>/= 1 - 1.4 cm) and appearance, a follow-up ultrasound
in 1 year should be considered based on TI-RADS criteria.

_________________________________________________________

Nodule # 3:

Location: Left; mid

Maximum size: 1.2 cm; Other 2 dimensions: 1.1 x 1.1 cm

Composition: solid/almost completely solid (2)

Echogenicity: hypoechoic (2)

Shape: not taller-than-wide (0)

Margins: smooth (0)

Echogenic foci: none (0)

ACR TI-RADS total points: 4.

ACR TI-RADS risk category: TR4 (4-6 points).

ACR TI-RADS recommendations:

*Given size (>/= 1 - 1.4 cm) and appearance, a follow-up ultrasound
in 1 year should be considered based on TI-RADS criteria.

_________________________________________________________

Nodule # 4:

Location: Left; inferior

Maximum size: 3.2 cm; Other 2 dimensions: 3.2 x 2.9 cm

Composition: solid/almost completely solid (2)

Echogenicity: hypoechoic (2)

Shape: not taller-than-wide (0)

Margins: ill-defined (0)

Echogenic foci: none (0)

ACR TI-RADS total points: 4.

ACR TI-RADS risk category: TR4 (4-6 points).

ACR TI-RADS recommendations:

**Given size (>/= 1.5 cm) and appearance, fine needle aspiration of
this moderately suspicious nodule should be considered based on
TI-RADS criteria.

_________________________________________________________
IMPRESSION: 1. Nodule 4 (TI-RADS 4) located in the inferior left thyroid lobe
meets criteria for FNA. This could potentially represent a pseudo
nodule.
2. Remaining nodules meet criteria for imaging follow-up. Annual
ultrasound surveillance is recommended until 5 years of stability is
documented.

The above is in keeping with the ACR TI-RADS recommendations - [HOSPITAL] 1980;[DATE].

## 2023-07-04 ENCOUNTER — Encounter: Payer: Self-pay | Admitting: Thoracic Surgery (Cardiothoracic Vascular Surgery)

## 2023-07-04 ENCOUNTER — Institutional Professional Consult (permissible substitution) (INDEPENDENT_AMBULATORY_CARE_PROVIDER_SITE_OTHER): Payer: No Typology Code available for payment source | Admitting: Thoracic Surgery (Cardiothoracic Vascular Surgery)

## 2023-07-04 VITALS — BP 137/80 | HR 84 | Resp 20 | Ht 70.0 in | Wt 289.0 lb

## 2023-07-04 DIAGNOSIS — I7121 Aneurysm of the ascending aorta, without rupture: Secondary | ICD-10-CM | POA: Diagnosis not present

## 2023-07-04 NOTE — Progress Notes (Signed)
PCP is Elfredia Nevins, MD Referring Provider is Wendall Stade, MD  Chief Complaint  Patient presents with   Thoracic Aortic Aneurysm    New patient consultation, CTA chest 10/3    HPI:[name] is sent for consultation regarding an ascending thoracic aneurysm.  Rodney Moreno is a 72 year old man with a past medical history significant for an ascending aortic aneurysm, CAD, MI, bladder cancer, obesity, hypertension, and sleep apnea.  He was first noted to have an ascending aneurysm while living in [place] several years ago.  Has been followed since then.  After he retired he moved to [facility] and established with Dr. Sherwood Gambler and Dr. Eden Emms.  Recently had a CT angiogram of the chest to follow-up an ascending aneurysm.  The aneurysm was reported to be 4.9 cm.  In October 2023 was reported to be 4.5 cm.  He has been having some chest discomfort.  He describes this as a burning sensation.  Not necessarily exertional.  He thinks it might be a little bit anxiety related after learning he had an appointment with a surgeon.   Past Medical History:  Diagnosis Date   Aortic aneurysm (HCC)    Bladder cancer (HCC)    Hypertension    Myocardial infarction [facility])    Sleep apnea     Past Surgical History:  Procedure Laterality Date   BACK SURGERY  2003   BLADDER INSTILLATION N/A 03/03/2022   Procedure: BLADDER INSTILLATION-GEMCITABINE;  Surgeon: Malen Gauze, MD;  Location: [facility];  Service: Urology;  Laterality: N/A;   CYSTOSCOPY N/A 03/28/2022   Procedure: CYSTOSCOPY;  Surgeon: Malen Gauze, MD;  Location: [facility];  Service: Urology;  Laterality: N/A;   CYSTOSCOPY W/ RETROGRADES Bilateral 03/03/2022   Procedure: CYSTOSCOPY WITH RETROGRADE PYELOGRAM;  Surgeon: Malen Gauze, MD;  Location: [facility];  Service: Urology;  Laterality: Bilateral;   MOUTH SURGERY     TRANSURETHRAL RESECTION OF BLADDER TUMOR N/A 03/03/2022   Procedure: TRANSURETHRAL RESECTION OF BLADDER TUMOR (TURBT);   Surgeon: Malen Gauze, MD;  Location: [facility];  Service: Urology;  Laterality: N/A;   TRANSURETHRAL RESECTION OF BLADDER TUMOR N/A 03/28/2022   Procedure: TRANSURETHRAL RESECTION OF BLADDER TUMOR (TURBT);  Surgeon: Malen Gauze, MD;  Location: [facility];  Service: Urology;  Laterality: N/A;    Family History  Problem Relation Age of Onset   Cancer Father     Social History Social History   Tobacco Use   Smoking status: Never   Smokeless tobacco: Never  Vaping Use   Vaping status: Never Used  Substance Use Topics   Alcohol use: Not Currently   Drug use: Never    Current Outpatient Medications  Medication Sig Dispense Refill   acetaminophen-codeine (TYLENOL #4) 300-60 MG tablet Take by mouth.     Alirocumab 150 MG/ML SOAJ Inject 150 mg into the skin every 14 (fourteen) days.     allopurinol (ZYLOPRIM) 300 MG tablet Take 300 mg by mouth daily.     amLODipine (NORVASC) 5 MG tablet Take 1 tablet (5 mg total) by mouth daily. 90 tablet 3   aspirin EC 81 MG tablet Take 81 mg by mouth daily. Swallow whole.     valsartan-hydrochlorothiazide (DIOVAN HCT) 320-12.5 MG tablet Take 1 tablet by mouth daily. 90 tablet 3   No current facility-administered medications for this visit.    No Known Allergies  Review of Systems  Respiratory:  Positive for apnea.   Cardiovascular:  Positive for chest pain.  Psychiatric/Behavioral:  The patient is nervous/anxious.     BP 137/80 (BP Location: Left Arm, Patient Position: Sitting, Cuff Size: Large)   Pulse 84   Resp 20   Ht 5\' 10"  (1.778 m)   Wt 289 lb (131.1 kg)   SpO2 99% Comment: ra  BMI 41.47 kg/m  Physical Exam Constitutional:      General: He is not in acute distress.    Appearance: He is obese.  HENT:     Head: Normocephalic and atraumatic.  Eyes:     General: No scleral icterus.    Extraocular Movements: Extraocular movements intact.  Cardiovascular:     Rate and Rhythm: Normal rate and regular rhythm.     Heart  sounds: Normal heart sounds. No murmur heard.    No friction rub. No gallop.  Pulmonary:     Effort: Pulmonary effort is normal. No respiratory distress.     Breath sounds: Normal breath sounds. No wheezing.  Abdominal:     Palpations: Abdomen is soft.  Musculoskeletal:     Cervical back: Neck supple.  Lymphadenopathy:     Cervical: No cervical adenopathy.  Skin:    General: Skin is warm and dry.  Neurological:     General: No focal deficit present.     Mental Status: He is alert and oriented to person, place, and time.     Cranial Nerves: No cranial nerve deficit.     Motor: No weakness.     Diagnostic Tests: CT ANGIOGRAPHY CHEST WITH CONTRAST   TECHNIQUE: Multidetector CT imaging of the chest was performed using the standard protocol during bolus administration of intravenous contrast. Multiplanar CT image reconstructions and MIPs were obtained to evaluate the vascular anatomy.   RADIATION DOSE REDUCTION: This exam was performed according to the departmental dose-optimization program which includes automated exposure control, adjustment of the mA and/or kV according to patient size and/or use of iterative reconstruction technique.   CONTRAST:  OMNIPAQUE IOHEXOL 300 MG/ML  SOLN   COMPARISON:  05/31/2022, 08/12/2021.   FINDINGS: Cardiovascular: The heart is mildly enlarged and there is no pericardial effusion. Scattered coronary artery calcifications are noted. There is atherosclerotic calcification of the aorta with aneurysmal dilatation of the ascending aorta measuring 4.9 cm. No dissection is seen. The aortic arch has a normal three-vessel configuration. Pulmonary trunk is mildly distended suggesting underlying pulmonary artery hypertension.   Mediastinum/Nodes: No mediastinal, hilar, or axillary lymphadenopathy. There is a 3.5 cm nodule in the left lobe of the thyroid gland, previously assessed by ultrasound and biopsy. The trachea and esophagus are within  normal limits.   Lungs/Pleura: Atelectasis is noted bilaterally. No effusion or pneumothorax.   Upper Abdomen: Fatty infiltration of the liver is noted. No acute abnormality.   Musculoskeletal: Cervical spinal fusion hardware is noted. No acute osseous abnormality.   Review of the MIP images confirms the above findings.   IMPRESSION: 1. Aortic atherosclerosis with aneurysmal dilatation of the ascending aorta measuring 4.9 cm. Ascending thoracic aortic aneurysm. Recommend semi-annual imaging followup by CTA or MRA and referral to cardiothoracic surgery if not already obtained. This recommendation follows 2010 ACCF/AHA/AATS/ACR/ASA/SCA/SCAI/SIR/STS/SVM Guidelines for the Diagnosis and Management of Patients With Thoracic Aortic Disease. Circulation. 2010; 121: I433-I951. Aortic aneurysm NOS (ICD10-I71.9) 2. Distended pulmonary trunk suggesting underlying pulmonary artery hypertension. 3. Coronary artery calcifications. 4. Hepatic steatosis.     Electronically Signed   By: Thornell Sartorius M.D.   On: 06/07/2023 02:49  I personally reviewed the CT images.  I  get a maximal measurement of around 4.7 cm which is essentially unchanged from his study a year ago.  Impression: Rodney Moreno is a 72 year old man with a past medical history significant for an ascending aortic aneurysm, CAD, MI, bladder cancer, obesity, hypertension, and sleep apnea.    Ascending aneurysm-measured at multiple areas and the maximal true cross-sectional diameter that I measured was 4.7 cm.  Its essentially unchanged from his scan a year ago.  He does need semiannual follow-up and we will arrange for a CT angiogram in 6 months.  I advised him to avoid very heavy lifting.  No restrictions on his aerobic activities.  Chest discomfort-not exertional.  He does have coronary calcification, but I do not know the extent of his coronary disease.  Recommended he follow-up with cardiology regarding that  issue.  Hypertension-blood pressure is a little higher than ideal with systolic in the 130s.  He is very anxious about having to see a surgeon and thought there might be urgent surgery required.  He is on Norvasc and Diovan.  Continue to monitor.  Plan: Follow-up with cardiology regarding chest discomfort Return in 6 months with CT angiogram of chest  I spent over 30 minutes in review of records, images, and consultation with Mr. Eline today. Loreli Slot, MD [facility] [phone]

## 2023-07-19 DIAGNOSIS — L570 Actinic keratosis: Secondary | ICD-10-CM | POA: Diagnosis not present

## 2023-07-19 DIAGNOSIS — X32XXXD Exposure to sunlight, subsequent encounter: Secondary | ICD-10-CM | POA: Diagnosis not present

## 2023-07-19 DIAGNOSIS — Z08 Encounter for follow-up examination after completed treatment for malignant neoplasm: Secondary | ICD-10-CM | POA: Diagnosis not present

## 2023-07-19 DIAGNOSIS — Z85828 Personal history of other malignant neoplasm of skin: Secondary | ICD-10-CM | POA: Diagnosis not present

## 2023-07-19 DIAGNOSIS — C44311 Basal cell carcinoma of skin of nose: Secondary | ICD-10-CM | POA: Diagnosis not present

## 2023-08-16 ENCOUNTER — Other Ambulatory Visit: Payer: No Typology Code available for payment source | Admitting: Urology

## 2023-08-25 NOTE — Progress Notes (Signed)
   Cardiology Office Note    Date:  09/01/2023  ID:  Rodney Moreno, DOB [birth year], MRN [id number] Cardiologist: Maude Emmer, MD    History of Present Illness:    Rodney Moreno is a 72 y.o. male with past medical history of ascending aortic aneurysm (at 4.5 cm by CT in 05/2022), CAD (nonobstructive disease by prior catheterization in 2022), HTN, HLD, OSA and history of bladder cancer who presents to the office today   He was last examined by me  11/2022 and had been evaluated in the ED for elevated blood pressure but left without being seen. He was using as needed Clonidine at the time of his office visit and given that this was not ideal, he was switched from Losartan to Valsartan -HCTZ 320-12.5 mg daily and also started on Amlodipine  5 mg daily with follow-up recommended for reassessment.  Says his blood pressure has significantly improved and his systolic readings are typically in the 120's. Were previously as high as the 180's. He denies any chest pain or dyspnea on exertion. No specific palpitations, orthopnea, PND or pitting edema. He does use his CPAP at night.   Was in [place]  for a while earlier this year Has a real appreciation for nature [place]   Having some chest discomfort November Burning sensation Not always exertional ? Anxiety regarding aneurysm.  Seen by Dr Kerrin 07/04/23 measured by CVTS at 4.7 cm Observation and repeat scanning for now Did have some coronary calcium No more pain after the 48 hours in November He goes to the gym 3-4 times /week lifting light weights and walking with no angina now      Studies Reviewed:   EKG: EKG is not ordered today.  Echocardiogram: 06/2021    Physical Exam:   VS:  BP 132/84 (BP Location: Left Arm, Patient Position: Sitting, Cuff Size: Large)   Pulse 94   Ht 5' 10 (1.778 m)   Wt 288 lb (130.6 kg)   SpO2 96%   BMI 41.32 kg/m    Wt Readings from Last 3 Encounters:  09/01/23 288 lb (130.6 kg)   07/04/23 289 lb (131.1 kg)  01/26/23 288 lb (130.6 kg)     Affect appropriate Obese white male  HEENT: normal Neck supple with no adenopathy JVP normal no bruits no thyromegaly Lungs clear with no wheezing and good diaphragmatic motion Heart:  S1/S2 no murmur, no rub, gallop or click PMI normal Abdomen: benighn, BS positve, no tenderness, no AAA no bruit.  No HSM or HJR Distal pulses intact with no bruits Plus one bilateral  edema Neuro non-focal Skin warm and dry No muscular weakness    Assessment and Plan:   1. HTN - Improved now with Amlodipine  5 mg daily and Valsartan -HCTZ 320-12.5 mg daily.    2. CAD - By review of records, he had nonobstructive disease by prior catheterization in 2022. Some chest pain not very typical will update lexiscan  myovue   3. Ascending Aortic Aneurysm - This measured 4.7 cm CTA 06/07/23  .F/U with Dr Kerrin   4. HLD - FLP in 11/2022 showed total cholesterol 86, triglycerides 80, HDL 46 and LDL 24. Continue with Repatha .  5. OSA - Continued compliance with CPAP encouraged.  6. LBBB:  chronic no change on ECG today will need Lexiscan  myovue for stress testing    Lexiscan   myovue  F/U in a year if non ischemic   Signed, Maude Emmer, MD

## 2023-09-01 ENCOUNTER — Telehealth: Payer: Self-pay | Admitting: Cardiovascular Disease

## 2023-09-01 ENCOUNTER — Encounter: Payer: Self-pay | Admitting: Cardiovascular Disease

## 2023-09-01 ENCOUNTER — Ambulatory Visit
Payer: No Typology Code available for payment source | Attending: Cardiovascular Disease | Admitting: Cardiovascular Disease

## 2023-09-01 ENCOUNTER — Encounter: Payer: Self-pay | Admitting: *Deleted

## 2023-09-01 VITALS — BP 132/84 | HR 94 | Ht 70.0 in | Wt 288.0 lb

## 2023-09-01 DIAGNOSIS — R079 Chest pain, unspecified: Secondary | ICD-10-CM | POA: Diagnosis not present

## 2023-09-01 DIAGNOSIS — E785 Hyperlipidemia, unspecified: Secondary | ICD-10-CM

## 2023-09-01 DIAGNOSIS — I7121 Aneurysm of the ascending aorta, without rupture: Secondary | ICD-10-CM

## 2023-09-01 DIAGNOSIS — I10 Essential (primary) hypertension: Secondary | ICD-10-CM

## 2023-09-01 NOTE — Patient Instructions (Signed)
Medication Instructions:  Your physician recommends that you continue on your current medications as directed. Please refer to the Current Medication list given to you today.  *If you need a refill on your cardiac medications before your next appointment, please call your pharmacy*   Lab Work: NONE   If you have labs (blood work) drawn today and your tests are completely normal, you will receive your results only by: MyChart Message (if you have MyChart) OR A paper copy in the mail If you have any lab test that is abnormal or we need to change your treatment, we will call you to review the results.   Testing/Procedures: Your physician has requested that you have a lexiscan myoview. For further information please visit [email or web address] Please follow instruction sheet, as given.    Follow-Up: At [facility], you and your health needs are our priority.  As part of our continuing mission to provide you with exceptional heart care, we have created designated Provider Care Teams.  These Care Teams include your primary Cardiologist (physician) and Advanced Practice Providers (APPs -  Physician Assistants and Nurse Practitioners) who all work together to provide you with the care you need, when you need it.  We recommend signing up for the patient portal called "MyChart".  Sign up information is provided on this After Visit Summary.  MyChart is used to connect with patients for Virtual Visits (Telemedicine).  Patients are able to view lab/test results, encounter notes, upcoming appointments, etc.  Non-urgent messages can be sent to your provider as well.   To learn more about what you can do with MyChart, go to [email or web address].    Your next appointment:   1 year(s)  Provider:   You may see Charlton Haws, MD or one of the following Advanced Practice Providers on your designated Care Team:   Randall An, PA-C  Jacolyn Reedy, PA-C     Other Instructions Thank you for  choosing [facility]!

## 2023-09-01 NOTE — Telephone Encounter (Signed)
 Patient questioned if EKG looked normal. Advised pt that EKG showed no changes from prior imaging from LBBB and would need stress test as ordered by provider. Pt verbalized understanding.

## 2023-09-01 NOTE — Telephone Encounter (Signed)
 Patient is requesting call back to discuss EKG results that he received on MyChart.

## 2023-09-06 ENCOUNTER — Ambulatory Visit: Payer: No Typology Code available for payment source | Admitting: Urology

## 2023-09-06 VITALS — BP 154/79 | HR 101

## 2023-09-06 DIAGNOSIS — Z8551 Personal history of malignant neoplasm of bladder: Secondary | ICD-10-CM | POA: Diagnosis not present

## 2023-09-06 DIAGNOSIS — D494 Neoplasm of unspecified behavior of bladder: Secondary | ICD-10-CM

## 2023-09-06 LAB — URINALYSIS, ROUTINE W REFLEX MICROSCOPIC
Bilirubin, UA: NEGATIVE
Glucose, UA: NEGATIVE
Ketones, UA: NEGATIVE
Leukocytes,UA: NEGATIVE
Nitrite, UA: NEGATIVE
RBC, UA: NEGATIVE
Specific Gravity, UA: 1.03 (ref 1.005–1.030)
Urobilinogen, Ur: 0.2 mg/dL (ref 0.2–1.0)
pH, UA: 6 (ref 5.0–7.5)

## 2023-09-06 MED ORDER — CIPROFLOXACIN HCL 500 MG PO TABS
500.0000 mg | ORAL_TABLET | Freq: Once | ORAL | Status: AC
Start: 2023-09-06 — End: 2023-09-06
  Administered 2023-09-06: 500 mg via ORAL

## 2023-09-06 NOTE — Progress Notes (Signed)
   09/06/23  CC: followup bladder cancer   HPI: [name] is a 72yo here for cystoscopy for bladder cancer Blood pressure (!) 154/79, pulse (!) 101. NED. A&Ox3.   No respiratory distress   Abd soft, NT, ND Normal phallus with bilateral descended testicles  Cystoscopy Procedure Note  Patient identification was confirmed, informed consent was obtained, and patient was prepped using Betadine  solution.  Lidocaine  jelly was administered per urethral meatus.     Pre-Procedure: - Inspection reveals a normal caliber ureteral meatus.  Procedure: The flexible cystoscope was introduced without difficulty - No urethral strictures/lesions are present. - Enlarged prostate  - Normal bladder neck - Bilateral ureteral orifices identified - Bladder mucosa  reveals no ulcers, tumors, or lesions - No bladder stones - No trabeculation     Post-Procedure: - Patient tolerated the procedure well  Assessment/ Plan: Followup 3 months for cystoscopy  No follow-ups on file.  Belvie Clara, MD

## 2023-09-07 IMAGING — US US FNA BIOPSY THYROID 1ST LESION
1 series · 10 of 10 positions shown · non-contrast
Comparison: US Thyroid 06/07/21

MEDICATIONS:
10 cc 1% lidocaine

COMPLICATIONS:
None immediate.

INDICATION: Left inferior thyroid nodule

EXAM:
ULTRASOUND GUIDED FINE NEEDLE ASPIRATION OF INDETERMINATE THYROID
NODULE
TECHNIQUE: Informed written consent was obtained from the patient after a
discussion of the risks, benefits and alternatives to treatment.
Questions regarding the procedure were encouraged and answered. A
timeout was performed prior to the initiation of the procedure.

[Series 1: us fna biopsy thyroid 1st lesion · 0.09mm/px · 10 acquisitions, 10 frames shown]
[im 1/10]
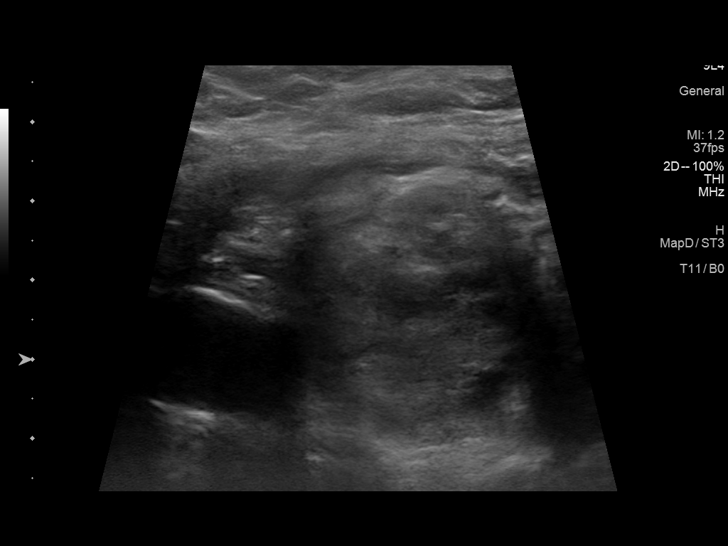
[im 2/10]
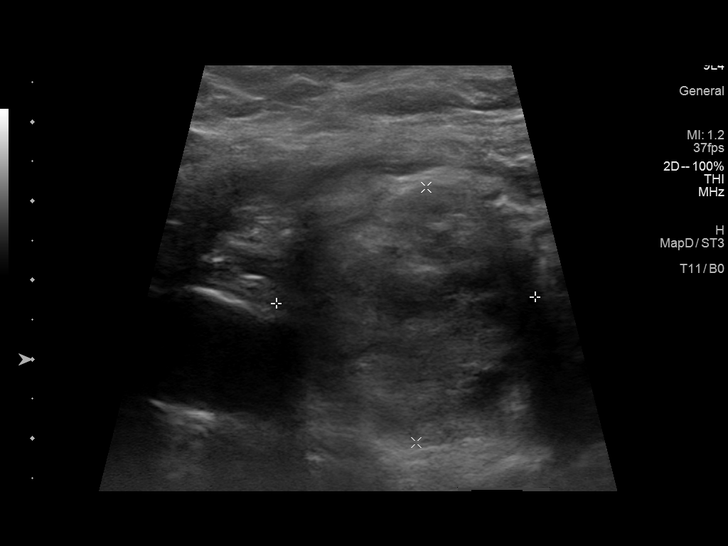
[im 3/10]
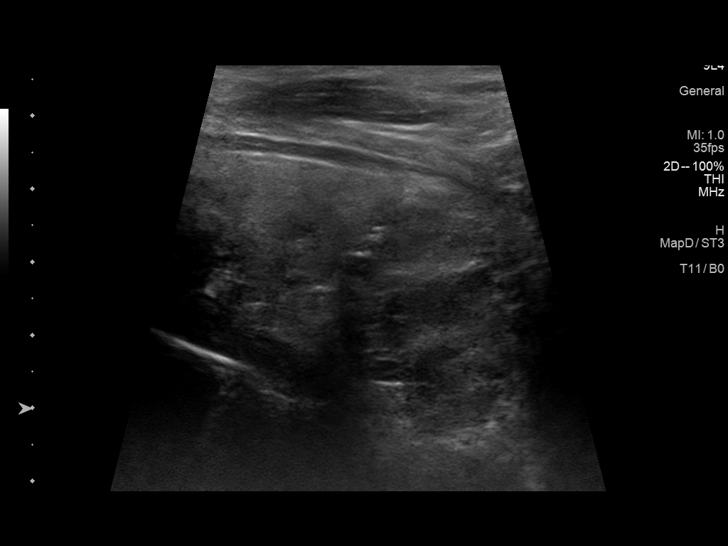
[im 4/10]
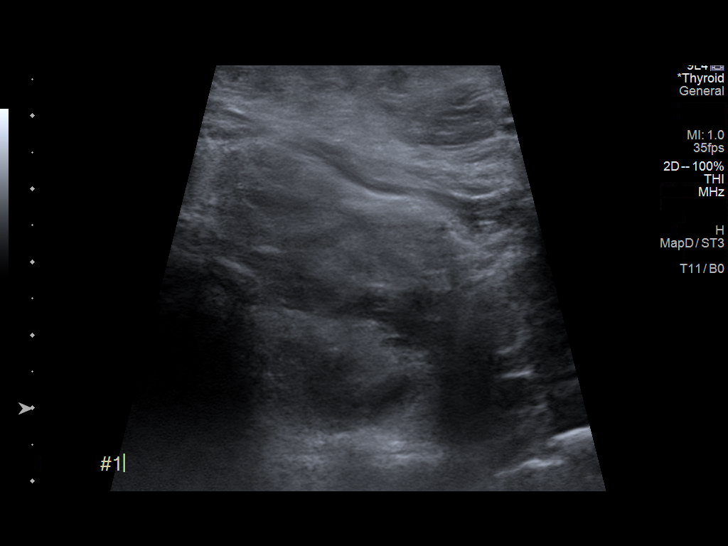
[im 5/10]
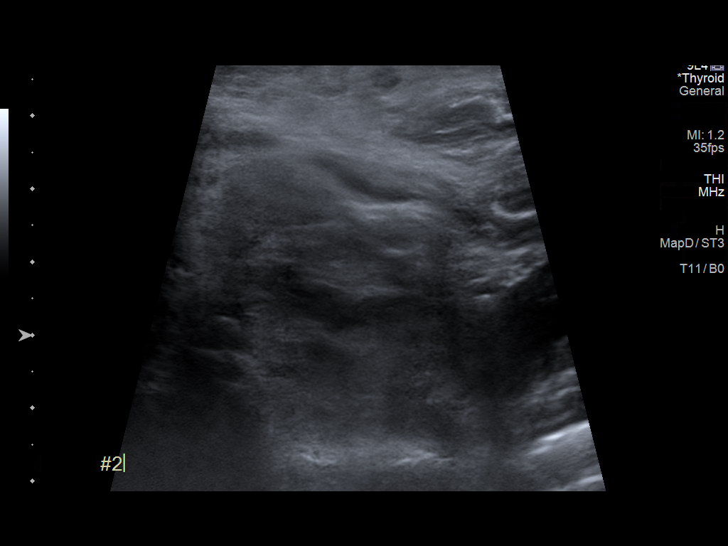
[im 6/10]
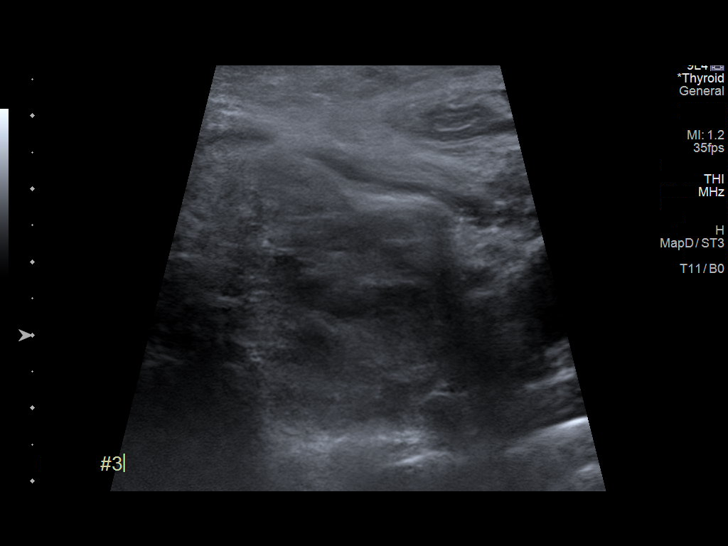
[im 7/10]
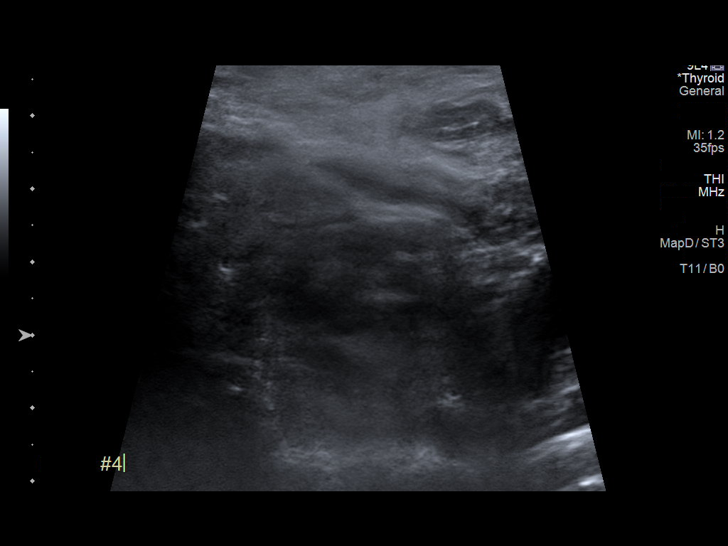
[im 8/10]
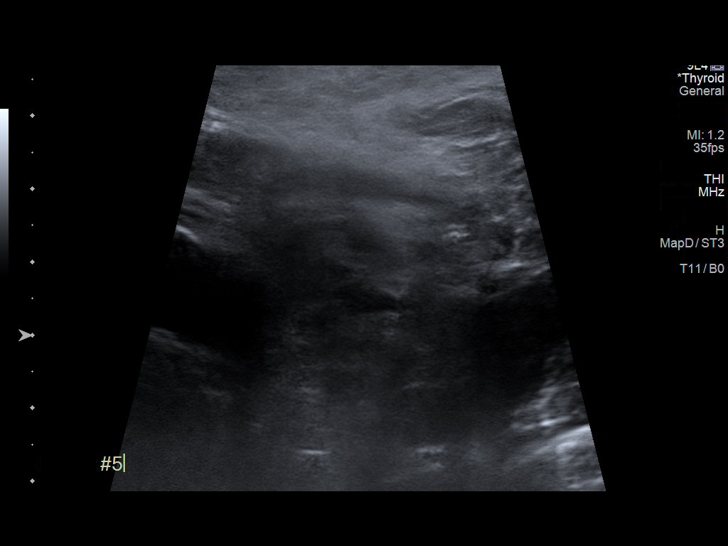
[im 9/10]
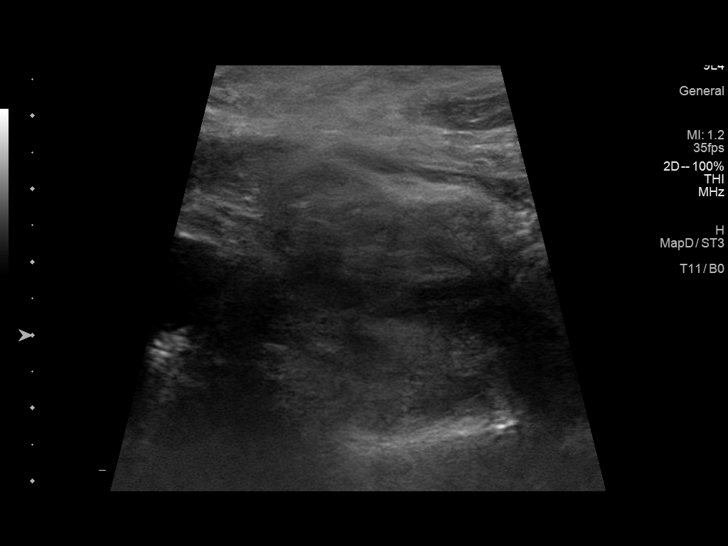
[im 10/10]
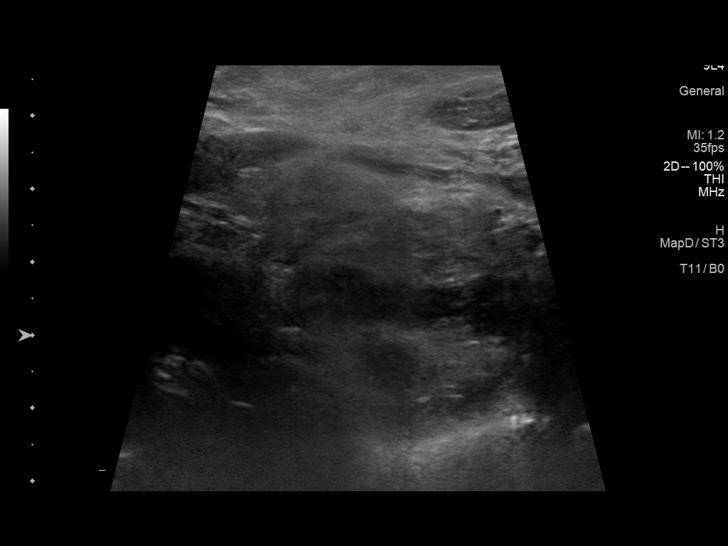

[10 of 10 positions shown; findings below may reference images not displayed]

Pre-procedural ultrasound scanning demonstrated unchanged size and
appearance of the indeterminate nodule within the left thyroid

The procedure was planned. The neck was prepped in the usual sterile
fashion, and a sterile drape was applied covering the operative
field. A timeout was performed prior to the initiation of the
procedure. Local anesthesia was provided with 1% lidocaine.

Under direct ultrasound guidance, 5 FNA biopsies were performed of
the left inferior thyroid nodule with a 27 gauge needle.

2 of these samples were obtained for AFIRMA

Multiple ultrasound images were saved for procedural documentation
purposes. The samples were prepared and submitted to pathology.

Limited post procedural scanning was negative for hematoma or
additional complication. Dressings were placed. The patient
tolerated the above procedures procedure well without immediate
postprocedural complication.
FINDINGS: Nodule reference number based on prior diagnostic ultrasound: 4

Maximum size: 3.2 cm

Location: Left; Inferior

ACR TI-RADS risk category: TR4 (4-6 points)

Reason for biopsy: meets ACR TI-RADS criteria

Ultrasound imaging confirms appropriate placement of the needles
within the thyroid nodule.
IMPRESSION: Technically successful ultrasound guided fine needle aspiration of
left inferior thyroid nodule

Read by

Moren Sibarani

## 2023-09-12 ENCOUNTER — Encounter (HOSPITAL_BASED_OUTPATIENT_CLINIC_OR_DEPARTMENT_OTHER)
Admission: RE | Admit: 2023-09-12 | Discharge: 2023-09-12 | Disposition: A | Discharge status: Home / Self Care | Payer: Medicare HMO | Source: Ambulatory Visit | Attending: Cardiovascular Disease | Admitting: Cardiovascular Disease

## 2023-09-12 ENCOUNTER — Encounter: Payer: Self-pay | Admitting: Urology

## 2023-09-12 ENCOUNTER — Ambulatory Visit (HOSPITAL_COMMUNITY)
Admission: RE | Admit: 2023-09-12 | Discharge: 2023-09-12 | Disposition: A | Discharge status: Home / Self Care | Payer: Medicare HMO | Source: Ambulatory Visit | Attending: Cardiovascular Disease | Admitting: Cardiovascular Disease

## 2023-09-12 DIAGNOSIS — I447 Left bundle-branch block, unspecified: Secondary | ICD-10-CM | POA: Insufficient documentation

## 2023-09-12 DIAGNOSIS — R079 Chest pain, unspecified: Secondary | ICD-10-CM | POA: Diagnosis not present

## 2023-09-12 LAB — NM MYOCAR MULTI W/SPECT W/WALL MOTION / EF
LV dias vol: 159 mL (ref 62–150)
LV sys vol: 71 mL
Nuc Stress EF: 56 %
Peak HR: 88 {beats}/min
RATE: 0.5
Rest HR: 65 {beats}/min
Rest Nuclear Isotope Dose: 10.6 mCi
SDS: 4
SRS: 9
SSS: 13
ST Depression (mm): 0 mm
Stress Nuclear Isotope Dose: 30.3 mCi
TID: 1.1

## 2023-09-12 MED ORDER — TECHNETIUM TC 99M TETROFOSMIN IV KIT
30.3000 | PACK | Freq: Once | INTRAVENOUS | Status: AC | PRN
  Administered 2023-09-12: 30.3 via INTRAVENOUS

## 2023-09-12 MED ORDER — TECHNETIUM TC 99M TETROFOSMIN IV KIT
10.6000 | PACK | Freq: Once | INTRAVENOUS | Status: AC | PRN
  Administered 2023-09-12: 10.6 via INTRAVENOUS

## 2023-09-12 MED ORDER — SODIUM CHLORIDE FLUSH 0.9 % IV SOLN
INTRAVENOUS | Status: AC
  Administered 2023-09-12: 10 mL via INTRAVENOUS
  Filled 2023-09-12: qty 10

## 2023-09-12 MED ORDER — REGADENOSON 0.4 MG/5ML IV SOLN
INTRAVENOUS | Status: AC
  Administered 2023-09-12: 0.4 mg via INTRAVENOUS
  Filled 2023-09-12: qty 5

## 2023-09-12 NOTE — Patient Instructions (Signed)

## 2023-11-06 DIAGNOSIS — Z08 Encounter for follow-up examination after completed treatment for malignant neoplasm: Secondary | ICD-10-CM | POA: Diagnosis not present

## 2023-11-06 DIAGNOSIS — Z1283 Encounter for screening for malignant neoplasm of skin: Secondary | ICD-10-CM | POA: Diagnosis not present

## 2023-11-06 DIAGNOSIS — D225 Melanocytic nevi of trunk: Secondary | ICD-10-CM | POA: Diagnosis not present

## 2023-11-06 DIAGNOSIS — D0461 Carcinoma in situ of skin of right upper limb, including shoulder: Secondary | ICD-10-CM | POA: Diagnosis not present

## 2023-11-06 DIAGNOSIS — Z85820 Personal history of malignant melanoma of skin: Secondary | ICD-10-CM | POA: Diagnosis not present

## 2023-11-06 DIAGNOSIS — X32XXXD Exposure to sunlight, subsequent encounter: Secondary | ICD-10-CM | POA: Diagnosis not present

## 2023-11-06 DIAGNOSIS — L570 Actinic keratosis: Secondary | ICD-10-CM | POA: Diagnosis not present

## 2023-12-06 ENCOUNTER — Ambulatory Visit: Payer: No Typology Code available for payment source | Admitting: Urology

## 2023-12-06 VITALS — BP 129/76 | HR 82

## 2023-12-06 DIAGNOSIS — D494 Neoplasm of unspecified behavior of bladder: Secondary | ICD-10-CM

## 2023-12-06 DIAGNOSIS — Z8551 Personal history of malignant neoplasm of bladder: Secondary | ICD-10-CM | POA: Diagnosis not present

## 2023-12-06 LAB — URINALYSIS, ROUTINE W REFLEX MICROSCOPIC
Bilirubin, UA: NEGATIVE
Glucose, UA: NEGATIVE
Ketones, UA: NEGATIVE
Leukocytes,UA: NEGATIVE
Nitrite, UA: NEGATIVE
Protein,UA: NEGATIVE
RBC, UA: NEGATIVE
Specific Gravity, UA: 1.025 (ref 1.005–1.030)
Urobilinogen, Ur: 0.2 mg/dL (ref 0.2–1.0)
pH, UA: 6 (ref 5.0–7.5)

## 2023-12-06 MED ORDER — CIPROFLOXACIN HCL 500 MG PO TABS
500.0000 mg | ORAL_TABLET | Freq: Once | ORAL | Status: AC
Start: 2023-12-06 — End: 2023-12-06
  Administered 2023-12-06: 500 mg via ORAL

## 2023-12-06 NOTE — Progress Notes (Signed)
   12/06/23  CC: bladder cancer   HPI: Rodney Moreno is a 73yo here for followup for bladder cancer Blood pressure 129/76, pulse 82. NED. A&Ox3.   No respiratory distress   Abd soft, NT, ND Normal phallus with bilateral descended testicles  Cystoscopy Procedure Note  Patient identification was confirmed, informed consent was obtained, and patient was prepped using Betadine solution.  Lidocaine  jelly was administered per urethral meatus.     Pre-Procedure: - Inspection reveals a normal caliber ureteral meatus.  Procedure: The flexible cystoscope was introduced without difficulty - No urethral strictures/lesions are present. - Enlarged prostate  - Normal bladder neck - Bilateral ureteral orifices identified - Bladder mucosa  reveals no ulcers, tumors, or lesions - No bladder stones - No trabeculation     Post-Procedure: - Patient tolerated the procedure well  Assessment/ Plan: Followuop 3 months for cystoscopy  No follow-ups on file.  Johnie Nailer, MD

## 2023-12-11 ENCOUNTER — Other Ambulatory Visit: Payer: Self-pay | Admitting: Thoracic Surgery (Cardiothoracic Vascular Surgery)

## 2023-12-11 DIAGNOSIS — I7121 Aneurysm of the ascending aorta, without rupture: Secondary | ICD-10-CM

## 2023-12-17 ENCOUNTER — Encounter: Payer: Self-pay | Admitting: Urology

## 2023-12-17 NOTE — Patient Instructions (Signed)

## 2023-12-25 DIAGNOSIS — I712 Thoracic aortic aneurysm, without rupture, unspecified: Secondary | ICD-10-CM | POA: Diagnosis not present

## 2023-12-25 DIAGNOSIS — I10 Essential (primary) hypertension: Secondary | ICD-10-CM | POA: Diagnosis not present

## 2023-12-25 DIAGNOSIS — Z136 Encounter for screening for cardiovascular disorders: Secondary | ICD-10-CM | POA: Diagnosis not present

## 2023-12-25 DIAGNOSIS — H02411 Mechanical ptosis of right eyelid: Secondary | ICD-10-CM | POA: Diagnosis not present

## 2023-12-25 DIAGNOSIS — Z9229 Personal history of other drug therapy: Secondary | ICD-10-CM | POA: Diagnosis not present

## 2023-12-25 DIAGNOSIS — Z125 Encounter for screening for malignant neoplasm of prostate: Secondary | ICD-10-CM | POA: Diagnosis not present

## 2023-12-25 DIAGNOSIS — I719 Aortic aneurysm of unspecified site, without rupture: Secondary | ICD-10-CM | POA: Diagnosis not present

## 2023-12-25 DIAGNOSIS — I700 Atherosclerosis of aorta: Secondary | ICD-10-CM | POA: Diagnosis not present

## 2023-12-25 DIAGNOSIS — Z0001 Encounter for general adult medical examination with abnormal findings: Secondary | ICD-10-CM | POA: Diagnosis not present

## 2023-12-26 DIAGNOSIS — Z9229 Personal history of other drug therapy: Secondary | ICD-10-CM | POA: Diagnosis not present

## 2023-12-26 DIAGNOSIS — I10 Essential (primary) hypertension: Secondary | ICD-10-CM | POA: Diagnosis not present

## 2023-12-26 DIAGNOSIS — Z125 Encounter for screening for malignant neoplasm of prostate: Secondary | ICD-10-CM | POA: Diagnosis not present

## 2023-12-26 DIAGNOSIS — E538 Deficiency of other specified B group vitamins: Secondary | ICD-10-CM | POA: Diagnosis not present

## 2023-12-26 DIAGNOSIS — R7309 Other abnormal glucose: Secondary | ICD-10-CM | POA: Diagnosis not present

## 2023-12-26 DIAGNOSIS — E559 Vitamin D deficiency, unspecified: Secondary | ICD-10-CM | POA: Diagnosis not present

## 2023-12-26 DIAGNOSIS — C679 Malignant neoplasm of bladder, unspecified: Secondary | ICD-10-CM | POA: Diagnosis not present

## 2023-12-26 DIAGNOSIS — Z0001 Encounter for general adult medical examination with abnormal findings: Secondary | ICD-10-CM | POA: Diagnosis not present

## 2023-12-28 ENCOUNTER — Encounter (INDEPENDENT_AMBULATORY_CARE_PROVIDER_SITE_OTHER): Payer: Self-pay | Admitting: Otolaryngology

## 2024-01-01 ENCOUNTER — Encounter: Payer: Self-pay | Admitting: Thoracic Surgery (Cardiothoracic Vascular Surgery)

## 2024-01-02 DIAGNOSIS — H2513 Age-related nuclear cataract, bilateral: Secondary | ICD-10-CM | POA: Diagnosis not present

## 2024-01-02 DIAGNOSIS — H524 Presbyopia: Secondary | ICD-10-CM | POA: Diagnosis not present

## 2024-01-02 DIAGNOSIS — H5203 Hypermetropia, bilateral: Secondary | ICD-10-CM | POA: Diagnosis not present

## 2024-01-02 DIAGNOSIS — H52223 Regular astigmatism, bilateral: Secondary | ICD-10-CM | POA: Diagnosis not present

## 2024-01-03 ENCOUNTER — Ambulatory Visit
Admission: RE | Admit: 2024-01-03 | Discharge: 2024-01-03 | Disposition: A | Discharge status: Home / Self Care | Source: Ambulatory Visit | Attending: Thoracic Surgery (Cardiothoracic Vascular Surgery) | Admitting: Thoracic Surgery (Cardiothoracic Vascular Surgery)

## 2024-01-03 DIAGNOSIS — I7121 Aneurysm of the ascending aorta, without rupture: Secondary | ICD-10-CM | POA: Diagnosis not present

## 2024-01-03 DIAGNOSIS — I700 Atherosclerosis of aorta: Secondary | ICD-10-CM | POA: Diagnosis not present

## 2024-01-03 DIAGNOSIS — E041 Nontoxic single thyroid nodule: Secondary | ICD-10-CM | POA: Diagnosis not present

## 2024-01-03 MED ORDER — IOPAMIDOL (ISOVUE-370) INJECTION 76%
75.0000 mL | Freq: Once | INTRAVENOUS | Status: AC | PRN
  Administered 2024-01-03: 75 mL via INTRAVENOUS

## 2024-01-08 ENCOUNTER — Other Ambulatory Visit: Payer: Self-pay

## 2024-01-08 MED ORDER — VALSARTAN-HYDROCHLOROTHIAZIDE 320-12.5 MG PO TABS: 1.0000 | ORAL_TABLET | Freq: Every day | ORAL | 3 refills | Status: AC

## 2024-01-11 ENCOUNTER — Ambulatory Visit: Attending: Thoracic Surgery (Cardiothoracic Vascular Surgery) | Admitting: Physician Assistant

## 2024-01-11 VITALS — BP 124/74 | HR 82 | Resp 20 | Ht 70.0 in | Wt 287.0 lb

## 2024-01-11 DIAGNOSIS — I7121 Aneurysm of the ascending aorta, without rupture: Secondary | ICD-10-CM

## 2024-01-11 NOTE — Progress Notes (Signed)
 HPI:  [name] is a 73 year old gentleman with a past history of hypertension, dyslipidemia, bladder cancer, obstructive sleep apnea, and coronary calcification seen on CT scan.  He was referred to us  last year for evaluation and surveillance of a 4.9 cm thoracic aortic aneurysm.  He returns today for recommended semiannual surveillance.  Current Outpatient Medications  Medication Sig Dispense Refill   acetaminophen -codeine (TYLENOL  #4) 300-60 MG tablet Take by mouth.     Alirocumab 150 MG/ML SOAJ Inject 150 mg into the skin every 14 (fourteen) days.     allopurinol (ZYLOPRIM) 300 MG tablet Take 300 mg by mouth daily.     amLODipine  (NORVASC ) 5 MG tablet Take 1 tablet (5 mg total) by mouth daily. 90 tablet 3   aspirin EC 81 MG tablet Take 81 mg by mouth daily. Swallow whole.     cloNIDine (CATAPRES) 0.1 MG tablet Take 1 tablet by mouth daily as needed.     valsartan -hydrochlorothiazide  (DIOVAN  HCT) 320-12.5 MG tablet Take 1 tablet by mouth daily. 90 tablet 3   No current facility-administered medications for this visit.    Physical Exam  Vital signs Blood pressure 124/74 Respirations 20 Heart rate 82 SpO2 95% on room air  General: Obese 73 year old male in no distress. Heart: Regular rate rhythm, no murmur. Chest: Breath sounds are clear to auscultation. Extremities: Well-perfused, trace lower extremity edema. Neuro: Grossly intact    Diagnostic Tests: CLINICAL DATA:  Aortic aneurysm suspected.   EXAM: CT ANGIOGRAPHY CHEST WITH CONTRAST   TECHNIQUE: Multidetector CT imaging of the chest was performed using the standard protocol during bolus administration of intravenous contrast. Multiplanar CT image reconstructions and MIPs were obtained to evaluate the vascular anatomy.   RADIATION DOSE REDUCTION: This exam was performed according to the departmental dose-optimization program which includes automated exposure control, adjustment of the mA and/or kV according  to patient size and/or use of iterative reconstruction technique.   CONTRAST:  75mL ISOVUE -370 IOPAMIDOL  (ISOVUE -370) INJECTION 76%   COMPARISON:  Chest CT 06/01/2023.   FINDINGS: Cardiovascular: There is preferential opacification of the thoracic aorta. Ascending aortic aneurysm measures 4.9 by 5.0 cm and appears unchanged. There is no evidence for aortic dissection. Descending thoracic aorta is normal in size. There are atherosclerotic calcifications of the aorta. There is no pericardial effusion.   Mediastinum/Nodes: There is a heterogeneous left thyroid  nodule measuring 3.0 x 3.5 cm which is unchanged. There are no enlarged mediastinal or hilar lymph nodes identified. Esophagus is within normal limits.   Lungs/Pleura: The lungs are clear. There is no pleural effusion or pneumothorax.   Upper Abdomen: No acute abnormality.   Musculoskeletal: Cervical spinal fusion plate is present. No acute fractures.   Review of the MIP images confirms the above findings.   IMPRESSION: 1. Stable ascending aortic aneurysm measuring 5.0 cm. No evidence for dissection. Recommend semi-annual imaging followup by CTA or MRA and referral to cardiothoracic surgery if not already obtained. This recommendation follows 2010 ACCF/AHA/AATS/ACR/ASA/SCA/SCAI/SIR/STS/SVM Guidelines for the Diagnosis and Management of Patients With Thoracic Aortic Disease. Circulation. 2010; 121: U045-W098. Aortic aneurysm NOS (ICD10-I71.9) 2. No acute cardiopulmonary process. 3. Stable left thyroid  nodule measuring 3.5 cm. 4. Aortic atherosclerosis.   Aortic Atherosclerosis (ICD10-I70.0).     Electronically Signed   By: Tyron Gallon M.D.   On: 01/03/2024 15:44  Impression: Stable 4.9 to 5.0 cm thoracic aortic aneurysm.  An echocardiogram done and November 2022 showed normal LV ejection fraction and normal valvular structure and function.  Plan:  Continue good control of blood pressure (prefer SBP 130/80 or  less)  2. Avoid fluoroquinolone antibiotics (I.e Ciprofloxacin , Avelox, Levofloxacin, Ofloxacin)  3.  Use of statin (to decrease cardiovascular risk)  4.  Exercise and activity limitations is individualized, but in general, contact sports are to be  avoided and one should avoid heavy lifting (defined as half of ideal body weight) and exercises involving sustained Valsalva maneuver.  5.  Follow-up in 6 months with CTA chest  Leata Providence, PA-C [facility] [phone]

## 2024-01-11 NOTE — Patient Instructions (Signed)
 Continue good control of blood pressure (prefer SBP 130/80 or less)  2. Avoid fluoroquinolone antibiotics (I.e Ciprofloxacin , Avelox, Levofloxacin, Ofloxacin)  3.  Exercise and activity limitations is individualized, but in general, contact sports are to be avoided and one should avoid heavy lifting (defined as half of ideal body weight) and exercises involving sustained Valsalva maneuver.  4.  Follow-up in 6 months with CTA chest

## 2024-02-02 ENCOUNTER — Other Ambulatory Visit: Payer: Self-pay | Admitting: Cardiovascular Disease

## 2024-02-02 ENCOUNTER — Other Ambulatory Visit: Payer: Self-pay | Admitting: Pharmacist Clinician (PhC)/ Clinical Pharmacy Specialist

## 2024-02-02 ENCOUNTER — Other Ambulatory Visit (HOSPITAL_COMMUNITY): Payer: Self-pay

## 2024-02-02 MED ORDER — ALIROCUMAB 150 MG/ML ~~LOC~~ SOAJ
150.0000 mg | SUBCUTANEOUS | 3 refills | Status: DC
  Filled 2024-02-02: qty 6, 84d supply, fill #0

## 2024-02-02 MED ORDER — REPATHA SURECLICK 140 MG/ML ~~LOC~~ SOAJ
140.0000 mg | SUBCUTANEOUS | 3 refills | Status: AC
Start: 2024-02-02 — End: ?

## 2024-02-05 DIAGNOSIS — L818 Other specified disorders of pigmentation: Secondary | ICD-10-CM | POA: Diagnosis not present

## 2024-02-05 DIAGNOSIS — D485 Neoplasm of uncertain behavior of skin: Secondary | ICD-10-CM | POA: Diagnosis not present

## 2024-02-05 DIAGNOSIS — L821 Other seborrheic keratosis: Secondary | ICD-10-CM | POA: Diagnosis not present

## 2024-02-05 DIAGNOSIS — X32XXXD Exposure to sunlight, subsequent encounter: Secondary | ICD-10-CM | POA: Diagnosis not present

## 2024-02-05 DIAGNOSIS — D225 Melanocytic nevi of trunk: Secondary | ICD-10-CM | POA: Diagnosis not present

## 2024-02-05 DIAGNOSIS — Z85820 Personal history of malignant melanoma of skin: Secondary | ICD-10-CM | POA: Diagnosis not present

## 2024-02-05 DIAGNOSIS — Z1283 Encounter for screening for malignant neoplasm of skin: Secondary | ICD-10-CM | POA: Diagnosis not present

## 2024-02-05 DIAGNOSIS — L820 Inflamed seborrheic keratosis: Secondary | ICD-10-CM | POA: Diagnosis not present

## 2024-02-05 DIAGNOSIS — L570 Actinic keratosis: Secondary | ICD-10-CM | POA: Diagnosis not present

## 2024-02-05 DIAGNOSIS — Z08 Encounter for follow-up examination after completed treatment for malignant neoplasm: Secondary | ICD-10-CM | POA: Diagnosis not present

## 2024-03-13 ENCOUNTER — Other Ambulatory Visit: Admitting: Urology

## 2024-03-14 ENCOUNTER — Other Ambulatory Visit: Payer: Self-pay

## 2024-03-14 MED ORDER — AMLODIPINE BESYLATE 5 MG PO TABS: 5.0000 mg | ORAL_TABLET | Freq: Every day | ORAL | 1 refills | Status: AC

## 2024-03-29 ENCOUNTER — Institutional Professional Consult (permissible substitution) (INDEPENDENT_AMBULATORY_CARE_PROVIDER_SITE_OTHER): Admitting: Otolaryngology

## 2024-04-03 ENCOUNTER — Other Ambulatory Visit: Admitting: Urology

## 2024-04-17 ENCOUNTER — Ambulatory Visit: Admitting: Urology

## 2024-04-17 ENCOUNTER — Encounter: Payer: Self-pay | Admitting: Urology

## 2024-04-17 VITALS — BP 122/77 | HR 82

## 2024-04-17 DIAGNOSIS — D494 Neoplasm of unspecified behavior of bladder: Secondary | ICD-10-CM | POA: Diagnosis not present

## 2024-04-17 DIAGNOSIS — C679 Malignant neoplasm of bladder, unspecified: Secondary | ICD-10-CM | POA: Diagnosis not present

## 2024-04-17 LAB — URINALYSIS, ROUTINE W REFLEX MICROSCOPIC
Bilirubin, UA: NEGATIVE
Glucose, UA: NEGATIVE
Ketones, UA: NEGATIVE
Leukocytes,UA: NEGATIVE
Nitrite, UA: NEGATIVE
RBC, UA: NEGATIVE
Specific Gravity, UA: 1.025 (ref 1.005–1.030)
Urobilinogen, Ur: 0.2 mg/dL (ref 0.2–1.0)
pH, UA: 5.5 (ref 5.0–7.5)

## 2024-04-17 MED ORDER — CIPROFLOXACIN HCL 500 MG PO TABS
500.0000 mg | ORAL_TABLET | Freq: Once | ORAL | Status: AC
  Administered 2024-04-17: 500 mg via ORAL

## 2024-04-17 NOTE — Progress Notes (Signed)
   04/17/24  CC: followup bladder cancer   HPI: [name] is a 73yo here for followup for bladder cancer Blood pressure 122/77, pulse 82. NED. A&Ox3.   No respiratory distress   Abd soft, NT, ND Normal phallus with bilateral descended testicles  Cystoscopy Procedure Note  Patient identification was confirmed, informed consent was obtained, and patient was prepped using Betadine solution.  Lidocaine  jelly was administered per urethral meatus.     Pre-Procedure: - Inspection reveals a normal caliber ureteral meatus.  Procedure: The flexible cystoscope was introduced without difficulty - No urethral strictures/lesions are present. - Enlarged prostate  - Normal bladder neck - Bilateral ureteral orifices identified - Bladder mucosa  reveals no ulcers, tumors, or lesions - No bladder stones - No trabeculation  Retroflexion shows    Post-Procedure: - Patient tolerated the procedure well  Assessment/ Plan: Followup 6 months with cystoscopy  No follow-ups on file.  Belvie Clara, MD

## 2024-04-17 NOTE — Patient Instructions (Signed)

## 2024-04-19 ENCOUNTER — Telehealth: Payer: Self-pay

## 2024-04-19 ENCOUNTER — Ambulatory Visit: Admitting: Plastic Surgery

## 2024-04-19 VITALS — BP 121/80 | HR 73 | Ht 70.0 in | Wt 280.0 lb

## 2024-04-19 DIAGNOSIS — H02834 Dermatochalasis of left upper eyelid: Secondary | ICD-10-CM | POA: Diagnosis not present

## 2024-04-19 DIAGNOSIS — H0289 Other specified disorders of eyelid: Secondary | ICD-10-CM | POA: Insufficient documentation

## 2024-04-19 DIAGNOSIS — H02831 Dermatochalasis of right upper eyelid: Secondary | ICD-10-CM

## 2024-04-19 DIAGNOSIS — H57813 Brow ptosis, bilateral: Secondary | ICD-10-CM | POA: Diagnosis not present

## 2024-04-19 DIAGNOSIS — H02839 Dermatochalasis of unspecified eye, unspecified eyelid: Secondary | ICD-10-CM | POA: Insufficient documentation

## 2024-04-19 DIAGNOSIS — I7121 Aneurysm of the ascending aorta, without rupture: Secondary | ICD-10-CM

## 2024-04-19 NOTE — Progress Notes (Signed)
 Patient ID: Rodney Moreno, male    DOB: 22-Mar-1951, 73 y.o.   MRN: [id number]   Chief Complaint  Patient presents with   Skin Problem   Advice Only    The patient is a 73 year old male here for evaluation of his face.  He has had a brow lift in the past with scars visible above the brow.  He has severe ptosis of the brow on the right side that is more than the left.  Both sides are quite ptotic.  He has noticed his vision being affected particularly at the end of the day and even when playing golf.  He has dermatochalasis of both upper eyelids.  He has not had his upper eyelids operated on in the past so far as he can remember or that I was told.  He also has excess scleral show on both eyes but worse on the left.  He has had some dry eyes in the past.  I think this may be a problem in the near future particularly with his age.    Review of Systems  Constitutional: Negative.   HENT: Negative.    Eyes: Negative.   Respiratory: Negative.    Cardiovascular: Negative.   Gastrointestinal: Negative.   Endocrine: Negative.   Genitourinary: Negative.   Musculoskeletal: Negative.   Skin: Negative.     Past Medical History:  Diagnosis Date   Aortic aneurysm [facility])    Bladder cancer (HCC)    Hypertension    Myocardial infarction [facility])    Sleep apnea     Past Surgical History:  Procedure Laterality Date   BACK SURGERY  2003   BLADDER INSTILLATION N/A 03/03/2022   Procedure: BLADDER INSTILLATION-GEMCITABINE ;  Surgeon: Sherrilee Belvie CROME, MD;  Location: [facility];  Service: Urology;  Laterality: N/A;   CYSTOSCOPY N/A 03/28/2022   Procedure: CYSTOSCOPY;  Surgeon: Sherrilee Belvie CROME, MD;  Location: [facility];  Service: Urology;  Laterality: N/A;   CYSTOSCOPY W/ RETROGRADES Bilateral 03/03/2022   Procedure: CYSTOSCOPY WITH RETROGRADE PYELOGRAM;  Surgeon: Sherrilee Belvie CROME, MD;  Location: [facility];  Service: Urology;  Laterality: Bilateral;   MOUTH SURGERY     TRANSURETHRAL RESECTION OF BLADDER  TUMOR N/A 03/03/2022   Procedure: TRANSURETHRAL RESECTION OF BLADDER TUMOR (TURBT);  Surgeon: Sherrilee Belvie CROME, MD;  Location: [facility];  Service: Urology;  Laterality: N/A;   TRANSURETHRAL RESECTION OF BLADDER TUMOR N/A 03/28/2022   Procedure: TRANSURETHRAL RESECTION OF BLADDER TUMOR (TURBT);  Surgeon: Sherrilee Belvie CROME, MD;  Location: [facility];  Service: Urology;  Laterality: N/A;      Current Outpatient Medications:    acetaminophen -codeine (TYLENOL  #4) 300-60 MG tablet, Take by mouth., Disp: , Rfl:    allopurinol (ZYLOPRIM) 300 MG tablet, Take 300 mg by mouth daily., Disp: , Rfl:    amLODipine  (NORVASC ) 5 MG tablet, Take 1 tablet (5 mg total) by mouth daily., Disp: 90 tablet, Rfl: 1   aspirin EC 81 MG tablet, Take 81 mg by mouth daily. Swallow whole., Disp: , Rfl:    cloNIDine (CATAPRES) 0.1 MG tablet, Take 1 tablet by mouth daily as needed., Disp: , Rfl:    Evolocumab  (REPATHA  SURECLICK) 140 MG/ML SOAJ, Inject 140 mg into the skin every 14 (fourteen) days., Disp: 6 mL, Rfl: 3   valsartan -hydrochlorothiazide  (DIOVAN  HCT) 320-12.5 MG tablet, Take 1 tablet by mouth daily., Disp: 90 tablet, Rfl: 3   Objective:   Vitals:   04/19/24 0901  BP: 121/80  Pulse: 73  SpO2: 100%    Physical Exam Vitals and nursing note reviewed.  Constitutional:      Appearance: Normal appearance.  HENT:     Head: Normocephalic.  Cardiovascular:     Rate and Rhythm: Normal rate.     Pulses: Normal pulses.  Pulmonary:     Effort: Pulmonary effort is normal.  Musculoskeletal:        General: No swelling or deformity.  Skin:    General: Skin is warm.     Capillary Refill: Capillary refill takes less than 2 seconds.     Coloration: Skin is not jaundiced.     Findings: No bruising.  Neurological:     Mental Status: He is alert and oriented to person, place, and time.  Psychiatric:        Mood and Affect: Mood normal.        Behavior: Behavior normal.        Thought Content: Thought content normal.         Judgment: Judgment normal.     Assessment & Plan:  No diagnosis found.  Would like him to have a visual field exam taped and untaped.  I had like to look at that and then plan on talking with the patient.  I think he would benefit from bilateral upper lid blepharoplasty as well as a brow lift and a lower lid canthopexy.  Pictures were obtained of the patient and placed in the chart with the patient's or guardian's permission.   Estefana RAMAN Dayquan Buys, DO

## 2024-04-19 NOTE — Addendum Note (Signed)
 Addended by: DEVONDA ISAIAH SAILOR on: 04/19/2024 11:22 AM   Modules accepted: Orders

## 2024-04-19 NOTE — Telephone Encounter (Signed)
 Faxed the Release of Information form to [facility] for a copy of the visual field test, both taped and untaped, with confirmed receipt.  Phone: [phone]  Fas: [phone]

## 2024-04-22 ENCOUNTER — Telehealth: Payer: Self-pay | Admitting: Plastic Surgery

## 2024-04-22 NOTE — Telephone Encounter (Signed)
 Pt is using [employer] verified by phone

## 2024-04-26 ENCOUNTER — Ambulatory Visit (INDEPENDENT_AMBULATORY_CARE_PROVIDER_SITE_OTHER): Admitting: Otolaryngology

## 2024-04-26 ENCOUNTER — Encounter (INDEPENDENT_AMBULATORY_CARE_PROVIDER_SITE_OTHER): Payer: Self-pay | Admitting: Otolaryngology

## 2024-04-26 VITALS — BP 108/69 | HR 80

## 2024-04-26 DIAGNOSIS — R0981 Nasal congestion: Secondary | ICD-10-CM

## 2024-04-26 DIAGNOSIS — R430 Anosmia: Secondary | ICD-10-CM

## 2024-04-26 DIAGNOSIS — J342 Deviated nasal septum: Secondary | ICD-10-CM

## 2024-04-26 DIAGNOSIS — J310 Chronic rhinitis: Secondary | ICD-10-CM

## 2024-04-26 DIAGNOSIS — J343 Hypertrophy of nasal turbinates: Secondary | ICD-10-CM | POA: Diagnosis not present

## 2024-04-27 DIAGNOSIS — J342 Deviated nasal septum: Secondary | ICD-10-CM | POA: Insufficient documentation

## 2024-04-27 DIAGNOSIS — R430 Anosmia: Secondary | ICD-10-CM | POA: Insufficient documentation

## 2024-04-27 DIAGNOSIS — J310 Chronic rhinitis: Secondary | ICD-10-CM | POA: Insufficient documentation

## 2024-04-27 DIAGNOSIS — J343 Hypertrophy of nasal turbinates: Secondary | ICD-10-CM | POA: Insufficient documentation

## 2024-04-27 NOTE — Progress Notes (Signed)
 Patient ID: Rodney Moreno, male   DOB: [birth year], 73 y.o.   MRN: [id number]  CC: Anosmia, decreased sense of taste  HPI:  Rodney Moreno is a 73 y.o. male who presents today complaining of anosmia and decreased sense of taste for the past 2 years.  He denies any preceding illnesses.  His symptoms were gradual in onset.  He denies any significant allergy symptoms.  He is able to breathe through both nostrils.  He has no previous ENT surgery.  He has a history of thyroid  nodules, and were biopsied previously.  Currently he denies any neurologic symptoms.  He denies any headaches, visual change, or other cranial nerve deficits.  Past Medical History:  Diagnosis Date   Aortic aneurysm [facility])    Bladder cancer (HCC)    Hypertension    Myocardial infarction [facility])    Sleep apnea     Past Surgical History:  Procedure Laterality Date   BACK SURGERY  2003   BLADDER INSTILLATION N/A 03/03/2022   Procedure: BLADDER INSTILLATION-GEMCITABINE ;  Surgeon: Sherrilee Belvie CROME, MD;  Location: [facility];  Service: Urology;  Laterality: N/A;   CYSTOSCOPY N/A 03/28/2022   Procedure: CYSTOSCOPY;  Surgeon: Sherrilee Belvie CROME, MD;  Location: [facility];  Service: Urology;  Laterality: N/A;   CYSTOSCOPY W/ RETROGRADES Bilateral 03/03/2022   Procedure: CYSTOSCOPY WITH RETROGRADE PYELOGRAM;  Surgeon: Sherrilee Belvie CROME, MD;  Location: [facility];  Service: Urology;  Laterality: Bilateral;   MOUTH SURGERY     TRANSURETHRAL RESECTION OF BLADDER TUMOR N/A 03/03/2022   Procedure: TRANSURETHRAL RESECTION OF BLADDER TUMOR (TURBT);  Surgeon: Sherrilee Belvie CROME, MD;  Location: [facility];  Service: Urology;  Laterality: N/A;   TRANSURETHRAL RESECTION OF BLADDER TUMOR N/A 03/28/2022   Procedure: TRANSURETHRAL RESECTION OF BLADDER TUMOR (TURBT);  Surgeon: Sherrilee Belvie CROME, MD;  Location: [facility];  Service: Urology;  Laterality: N/A;    Family History  Problem Relation Age of Onset   Cancer Father     Social History:  reports that he has  never smoked. He has never used smokeless tobacco. He reports that he does not currently use alcohol. He reports that he does not use drugs.  Allergies: No Known Allergies  Prior to Admission medications   Medication Sig Start Date End Date Taking? Authorizing Provider  acetaminophen -codeine (TYLENOL  #4) 300-60 MG tablet Take by mouth. 11/01/22  Yes [provider]  allopurinol (ZYLOPRIM) 300 MG tablet Take 300 mg by mouth daily. 01/17/22  Yes [provider]  amLODipine  (NORVASC ) 5 MG tablet Take 1 tablet (5 mg total) by mouth daily. 03/14/24  Yes Nishan, Peter C, MD  aspirin EC 81 MG tablet Take 81 mg by mouth daily. Swallow whole.   Yes [provider]  cloNIDine (CATAPRES) 0.1 MG tablet Take 1 tablet by mouth daily as needed. 12/26/22  Yes [provider]  Evolocumab  (REPATHA  SURECLICK) 140 MG/ML SOAJ Inject 140 mg into the skin every 14 (fourteen) days. 02/02/24  Yes Rodney Maude JAYSON, MD  valsartan -hydrochlorothiazide  (DIOVAN  HCT) 320-12.5 MG tablet Take 1 tablet by mouth daily. 01/08/24  Yes Rodney Maude JAYSON, MD    Blood pressure 108/69, pulse 80, SpO2 93%. Exam: General: Communicates without difficulty, well nourished, no acute distress. Head: Normocephalic, no evidence injury, no tenderness, facial buttresses intact without stepoff. Face/sinus: No tenderness to palpation and percussion. Facial movement is normal and symmetric. Eyes: PERRL, EOMI. No scleral icterus, conjunctivae clear. Neuro: CN II exam reveals vision grossly intact.  No nystagmus  at any point of gaze. Ears: Auricles well formed without lesions.  Ear canals are intact without mass or lesion.  No erythema or edema is appreciated.  The TMs are intact without fluid. Nose: External evaluation reveals normal support and skin without lesions.  Dorsum is intact.  Anterior rhinoscopy reveals congested mucosa over anterior aspect of inferior turbinates and mildly deviated septum.  No purulence noted. Oral:   Oral cavity and oropharynx are intact, symmetric, without erythema or edema.  Mucosa is moist without lesions. Neck: Full range of motion without pain.  There is no significant lymphadenopathy.  No masses palpable.  Thyroid  bed within normal limits to palpation.  Parotid glands and submandibular glands equal bilaterally without mass.  Trachea is midline. Neuro:  CN 2-12 grossly intact.   Procedure:  Flexible Nasal Endoscopy: Description: Risks, benefits, and alternatives of flexible endoscopy were explained to the patient.  Specific mention was made of the risk of throat numbness with difficulty swallowing, possible bleeding from the nose and mouth, and pain from the procedure.  The patient gave oral consent to proceed.  The flexible scope was inserted into the right nasal cavity.  Endoscopy of the interior nasal cavity, superior, inferior, and middle meatus was performed. The sphenoid-ethmoid recess was examined. Edematous mucosa was noted.  No polyp, mass, or lesion was appreciated.  Mild nasal septal deviation noted. Olfactory cleft was clear.  Nasopharynx was clear.  Turbinates were hypertrophied but without mass.  The procedure was repeated on the contralateral side with similar findings.  The patient tolerated the procedure well.   Assessment: 1.  Chronic anosmia of more than 2 years.  He has no other neurologic symptoms. 2.  Chronic rhinitis with mild nasal mucosal congestion, mild nasal septal deviation, and bilateral inferior turbinate hypertrophy.  No significant nasal obstruction is noted today.  His olfactory cleft is clear.  Plan: 1.  The physical exam and nasal endoscopy findings are reviewed with the patient. 2.  The patient is reassured that no obstructing mass or infection is noted today.  His anosmia is likely secondary to neuronal injury.  It is likely permanent. 3.  The option of MRI scan to evaluate for intracranial abnormality is discussed. 4.  The patient is encouraged to call with  any questions or concerns.  [name] 04/27/2024, 6:10 PM

## 2024-05-15 ENCOUNTER — Telehealth: Admitting: Plastic Surgery

## 2024-05-17 ENCOUNTER — Telehealth: Admitting: Plastic Surgery

## 2024-05-24 ENCOUNTER — Telehealth (INDEPENDENT_AMBULATORY_CARE_PROVIDER_SITE_OTHER): Admitting: Plastic Surgery

## 2024-05-24 DIAGNOSIS — H57813 Brow ptosis, bilateral: Secondary | ICD-10-CM

## 2024-05-24 DIAGNOSIS — H02831 Dermatochalasis of right upper eyelid: Secondary | ICD-10-CM | POA: Diagnosis not present

## 2024-05-24 DIAGNOSIS — H02834 Dermatochalasis of left upper eyelid: Secondary | ICD-10-CM

## 2024-05-24 NOTE — Progress Notes (Signed)
   Subjective:    Patient ID: Rodney Moreno, male    DOB: 1950/11/06, 72 y.o.   MRN: [id number]  The patient is a 73 year old male joining me by phone.  He is at home and I am at the office.  He has noticed decrease in his visual fields over the the past several years.  He had a brow and upper lid Bleph done approximately 15 years ago.  It looks like he is in need of repeat surgery.  He had his visual fields done last week which show marked improvement with taping of his upper lids.  He cannot fully remember if his upper limbs were operated on but he knows that his brows were.  He was also recently diagnosed with cataracts and knows that he will be having that surgery sometime soon.  We talked about making sure he has 6 weeks between the 2 surgeries.  He like to move ahead with his lid surgery.      Review of Systems  Constitutional: Negative.   HENT: Negative.    Eyes: Negative.   Respiratory: Negative.    Cardiovascular: Negative.   Gastrointestinal: Negative.   Endocrine: Negative.   Genitourinary: Negative.   Musculoskeletal: Negative.        Objective:   Physical Exam       Assessment & Plan:     ICD-10-CM   1. Brow ptosis, bilateral  H57.813     2. Dermatochalasis of both upper eyelids  H02.831    H02.834        I connected with  Rodney Moreno on 05/24/24 by phone and verified that I am speaking with the correct person using two identifiers.   I discussed the limitations of evaluation and management by telemedicine. The patient expressed understanding and agreed to proceed.  We spent 5 minutes in discussion.  Plan for bilateral upper lid blepharoplasty and brow lift.

## 2024-05-29 ENCOUNTER — Other Ambulatory Visit: Payer: Self-pay | Admitting: Nurse Practitioner

## 2024-05-29 DIAGNOSIS — E042 Nontoxic multinodular goiter: Secondary | ICD-10-CM

## 2024-06-05 ENCOUNTER — Ambulatory Visit
Admission: RE | Admit: 2024-06-05 | Discharge: 2024-06-05 | Disposition: A | Source: Ambulatory Visit | Attending: Nurse Practitioner

## 2024-06-05 DIAGNOSIS — E042 Nontoxic multinodular goiter: Secondary | ICD-10-CM

## 2024-06-11 ENCOUNTER — Encounter: Payer: Self-pay | Admitting: Nurse Practitioner

## 2024-06-11 ENCOUNTER — Other Ambulatory Visit: Payer: Self-pay | Admitting: Thoracic Surgery (Cardiothoracic Vascular Surgery)

## 2024-06-11 DIAGNOSIS — I7121 Aneurysm of the ascending aorta, without rupture: Secondary | ICD-10-CM

## 2024-06-11 DIAGNOSIS — E042 Nontoxic multinodular goiter: Secondary | ICD-10-CM

## 2024-06-19 NOTE — H&P (Signed)
 Surgical History & Physical  Patient Name: Rodney Moreno  DOB: 08/17/1951  Surgery: Cataract extraction with intraocular lens implant phacoemulsification; Right Eye Surgeon: Marsa Cleverly MD Surgery Date: 06/26/2024 Pre-Op Date: 06/04/2024  HPI: A 5 Yr. old male patient New Patient Cat Eval. Pt complains of blurred and decreased vision despite best corrected vision with glasses. The patient describes the vision as cloudy, and blurry without any improvement when wearing glasses. He c/o excessive glare from bright lights and headlights and decreased night vision. This is negatively affecting the patient's quality of life and the patient is unable to function adequately in life with the current level of vision.  Medical History: Cataracts  Arthritis Cancer High Blood Pressure Thyroid  Problems Ulcers  Review of Systems Cardiovascular High Blood Pressure Endocrine Thyroid  Nodules Eyes Cataracts Genitourinary Bladder Cancer Neurological Aneurisms All recorded systems are negative except as noted above.  Social Never smoked  Alcohol Former Drinker  Medication Prednisolone-moxiflox-bromfen,  Acetaminophen -codeine, Doxycycline hyclate, Amlodipine , Allopurinol, Tamsulosin, Evolocumab  140MG , Hydrochlorothiazide , Valsartan , Clonidine hcl, Losartan, Alirocumab  150MG , Aspirin  Sx/Procedures Bladder surgery (for cancer), Butterfly on spine  Drug Allergies  NKDA  History & Physical: Heent: cataracts NECK: supple without bruits LUNGS: lungs clear to auscultation CV: regular rate and rhythm Abdomen: soft and non-tender  Impression & Plan: Assessment: 1.  CATARACT NUCLEAR SCLEROSIS AGE RELATED; Both Eyes (H25.13) 2.  KERATOCONJUNCTIVITIS SICCA NOT SPECIFIED AS SJORGRENS; Both Eyes (H16.223) 3.  DERMATOCHALASIS, no surgery; Right Upper Lid, Left Upper Lid (H02.831, Y97.165)  Plan: 1.  Cataracts are visually significant and account for the patient's complaints. Discussed all risks,  benefits, procedures and recovery, including infection, loss of vision and eye, need for glasses after surgery or additional procedures. Patient understands changing glasses will not improve vision. Patient indicated understanding of procedure. All questions answered. Patient desires to have surgery, recommend phacoemulsification with intraocular lens. Patient to have preliminary testing necessary ([employer]/IOL Master, Mac OCT, TOPO) Educational materials provided:Cataract.  Plan: - Proceed with cataract surgery OD followed by OS - Plan for best distance target with DIB00 - No DM, no fuchs, no prior eye surgery - good dilation on Tamsulosin - discussed low astigmatism and may need glasses afterwards - Dextenza  if available  2.  Dry eye. Mild signs of dry eye at this time. Can use artificial tears QID OU PRN and warm compresses once daily as needed.  3.  Has been performed once previoulsy, recurrent signs

## 2024-06-20 ENCOUNTER — Other Ambulatory Visit: Payer: Self-pay | Admitting: Cardiovascular Disease

## 2024-06-21 ENCOUNTER — Encounter (HOSPITAL_COMMUNITY)
Admission: RE | Admit: 2024-06-21 | Discharge: 2024-06-21 | Disposition: A | Discharge status: Home / Self Care | Source: Ambulatory Visit | Attending: Optometry | Admitting: Optometry

## 2024-06-21 ENCOUNTER — Encounter (HOSPITAL_COMMUNITY): Payer: Self-pay

## 2024-06-21 ENCOUNTER — Other Ambulatory Visit: Payer: Self-pay

## 2024-06-21 NOTE — Pre-Procedure Instructions (Signed)
 Attempted pre-op phone call. Left VM for him to call us back.

## 2024-06-26 ENCOUNTER — Other Ambulatory Visit: Payer: Self-pay

## 2024-06-26 ENCOUNTER — Encounter (HOSPITAL_COMMUNITY)
Admission: RE | Disposition: A | Discharge status: Home / Self Care | Payer: Self-pay | Source: Home / Self Care | Attending: Optometry

## 2024-06-26 ENCOUNTER — Ambulatory Visit (HOSPITAL_COMMUNITY)

## 2024-06-26 ENCOUNTER — Ambulatory Visit (HOSPITAL_COMMUNITY)
Admission: RE | Admit: 2024-06-26 | Discharge: 2024-06-26 | Disposition: A | Discharge status: Home / Self Care | Attending: Optometry | Admitting: Optometry

## 2024-06-26 ENCOUNTER — Encounter (HOSPITAL_COMMUNITY): Payer: Self-pay | Admitting: Optometry

## 2024-06-26 DIAGNOSIS — H2511 Age-related nuclear cataract, right eye: Secondary | ICD-10-CM

## 2024-06-26 DIAGNOSIS — I10 Essential (primary) hypertension: Secondary | ICD-10-CM | POA: Insufficient documentation

## 2024-06-26 DIAGNOSIS — I447 Left bundle-branch block, unspecified: Secondary | ICD-10-CM | POA: Diagnosis not present

## 2024-06-26 DIAGNOSIS — H2513 Age-related nuclear cataract, bilateral: Secondary | ICD-10-CM | POA: Diagnosis present

## 2024-06-26 DIAGNOSIS — G4730 Sleep apnea, unspecified: Secondary | ICD-10-CM | POA: Diagnosis not present

## 2024-06-26 DIAGNOSIS — H02831 Dermatochalasis of right upper eyelid: Secondary | ICD-10-CM | POA: Diagnosis not present

## 2024-06-26 DIAGNOSIS — H16223 Keratoconjunctivitis sicca, not specified as Sjogren's, bilateral: Secondary | ICD-10-CM | POA: Diagnosis not present

## 2024-06-26 DIAGNOSIS — I252 Old myocardial infarction: Secondary | ICD-10-CM | POA: Diagnosis not present

## 2024-06-26 DIAGNOSIS — H02834 Dermatochalasis of left upper eyelid: Secondary | ICD-10-CM | POA: Diagnosis not present

## 2024-06-26 HISTORY — PX: CATARACT EXTRACTION W/PHACO: SHX586

## 2024-06-26 SURGERY — PHACOEMULSIFICATION, CATARACT, WITH IOL INSERTION
Anesthesia: Monitor Anesthesia Care | Site: Eye | Laterality: Right

## 2024-06-26 MED ORDER — PHENYLEPHRINE-KETOROLAC 1-0.3 % IO SOLN
INTRAOCULAR | Status: DC | PRN
  Administered 2024-06-26: 500 mL via OPHTHALMIC

## 2024-06-26 MED ORDER — SIGHTPATH DOSE#1 NA HYALUR & NA CHOND-NA HYALUR IO KIT
PACK | INTRAOCULAR | Status: DC | PRN
  Administered 2024-06-26: 1 via OPHTHALMIC

## 2024-06-26 MED ORDER — LIDOCAINE HCL 3.5 % OP GEL
1.0000 | Freq: Once | OPHTHALMIC | Status: AC
  Administered 2024-06-26: 1 via OPHTHALMIC

## 2024-06-26 MED ORDER — BSS IO SOLN
INTRAOCULAR | Status: DC | PRN
  Administered 2024-06-26: 15 mL via INTRAOCULAR

## 2024-06-26 MED ORDER — MIDAZOLAM HCL (PF) 2 MG/2ML IJ SOLN
INTRAMUSCULAR | Status: DC | PRN
  Administered 2024-06-26: 2 mg via INTRAVENOUS

## 2024-06-26 MED ORDER — PHENYLEPHRINE HCL 2.5 % OP SOLN
1.0000 [drp] | OPHTHALMIC | Status: AC
  Administered 2024-06-26 (×3): 1 [drp] via OPHTHALMIC

## 2024-06-26 MED ORDER — MOXIFLOXACIN HCL 5 MG/ML IO SOLN
INTRAOCULAR | Status: DC | PRN
  Administered 2024-06-26: .2 mL via OPHTHALMIC

## 2024-06-26 MED ORDER — POVIDONE-IODINE 5 % OP SOLN
OPHTHALMIC | Status: DC | PRN
  Administered 2024-06-26: 1 via OPHTHALMIC

## 2024-06-26 MED ORDER — TETRACAINE HCL 0.5 % OP SOLN
1.0000 [drp] | OPHTHALMIC | Status: AC
  Administered 2024-06-26 (×3): 1 [drp] via OPHTHALMIC

## 2024-06-26 MED ORDER — SODIUM CHLORIDE 0.9% FLUSH
INTRAVENOUS | Status: DC | PRN
  Administered 2024-06-26: 10 mL via INTRAVENOUS

## 2024-06-26 MED ORDER — STERILE WATER FOR IRRIGATION IR SOLN
Status: DC | PRN
  Administered 2024-06-26: 1

## 2024-06-26 MED ORDER — TROPICAMIDE 1 % OP SOLN
1.0000 [drp] | OPHTHALMIC | Status: AC
  Administered 2024-06-26 (×3): 1 [drp] via OPHTHALMIC

## 2024-06-26 MED ORDER — MIDAZOLAM HCL 2 MG/2ML IJ SOLN
INTRAMUSCULAR | Status: AC
  Filled 2024-06-26: qty 2

## 2024-06-26 MED ORDER — LIDOCAINE HCL (PF) 1 % IJ SOLN
INTRAMUSCULAR | Status: DC | PRN
  Administered 2024-06-26: 1 mL

## 2024-06-26 MED ORDER — LACTATED RINGERS IV SOLN: INTRAVENOUS | Status: DC

## 2024-06-26 SURGICAL SUPPLY — 12 items
CLOTH BEACON ORANGE TIMEOUT ST (SAFETY) ×1 IMPLANT
DRSG TEGADERM 4X4.75 (GAUZE/BANDAGES/DRESSINGS) ×1 IMPLANT
EYE SHIELD UNIVERSAL CLEAR (GAUZE/BANDAGES/DRESSINGS) IMPLANT
FEE CATARACT SUITE SIGHTPATH (MISCELLANEOUS) ×1 IMPLANT
GLOVE BIOGEL PI IND STRL 7.0 (GLOVE) ×2 IMPLANT
LENS IOL TECNIS EYHANCE 20.5 (Intraocular Lens) IMPLANT
NDL HYPO 18GX1.5 BLUNT FILL (NEEDLE) ×1 IMPLANT
NEEDLE HYPO 18GX1.5 BLUNT FILL (NEEDLE) ×1 IMPLANT
PAD ARMBOARD POSITIONER FOAM (MISCELLANEOUS) ×1 IMPLANT
SYR TB 1ML LL NO SAFETY (SYRINGE) ×1 IMPLANT
TAPE SURG TRANSPORE 1 IN (GAUZE/BANDAGES/DRESSINGS) IMPLANT
WATER STERILE IRR 250ML POUR (IV SOLUTION) ×1 IMPLANT

## 2024-06-26 NOTE — Op Note (Signed)
 Date of procedure: 06/26/24  Pre-operative diagnosis: Visually significant age-related nuclear cataract, Right Eye (H25.11)  Post-operative diagnosis: Visually significant age-related nuclear cataract, Right Eye H25.11  Procedure: Removal of cataract via phacoemulsification and insertion of intra-ocular lens J&J DIBOO +20.5D into the capsular bag of the Right Eye  Attending surgeon: Marsa Cleverly, MD  Anesthesia: MAC, Topical Akten  Complications: None  Estimated Blood Loss: <28mL (minimal)  Specimens: None  Implants:  Implant Name Type Inv. Item Serial No. Manufacturer Lot No. LRB No. Used Action  LENS IOL TECNIS EYHANCE 20.5 - [id number] Intraocular Lens LENS IOL TECNIS EYHANCE 20.5 [id number] SIGHTPATH  Right 1 Implanted    Indications:  Visually significant age-related cataract, Right Eye  Procedure:  The patient was seen and identified in the pre-operative area. The operative eye was identified and dilated.  The operative eye was marked.  Topical anesthesia was administered to the operative eye.     The patient was then to the operative suite and placed in the supine position.  A timeout was performed confirming the patient, procedure to be performed, and all other relevant information.   The patient's face was prepped and draped in the usual fashion for intra-ocular surgery.  A lid speculum was placed into the operative eye and the surgical microscope moved into place and focused.  A superotemporal paracentesis was created using a 20 gauge paracentesis blade.  BSS mixed with Omidria, followed by 1% lidocaine  was injected into the anterior chamber.  Viscoelastic was injected into the anterior chamber.  A temporal clear-corneal main wound incision was created using a 2.63mm microkeratome.  A continuous curvilinear capsulorrhexis was initiated using an irrigating cystitome and completed using capsulorrhexis forceps.  Hydrodissection and hydrodeliniation were performed.  Viscoelastic  was injected into the anterior chamber.  A phacoemulsification handpiece and a chopper as a second instrument were used to remove the nucleus and epinucleus. The irrigation/aspiration handpiece was used to remove any remaining cortical material.   The capsular bag was reinflated with viscoelastic, checked, and found to be intact.  The intraocular lens was inserted into the capsular bag.  The irrigation/aspiration handpiece was used to remove any remaining viscoelastic.  The clear corneal wound and paracentesis wounds were then hydrated and checked with Weck-Cels to be watertight. Moxifloxacin was instilled into the anterior chamber.  The lid-speculum and drape were removed. The patient's face was cleaned with a wet and dry 4x4. A clear shield was taped over the eye. The patient was taken to the post-operative care unit in good condition, having tolerated the procedure well.  Post-Op Instructions: The patient will follow up at [facility] for a same day post-operative evaluation and will receive all other orders and instructions.

## 2024-06-26 NOTE — Anesthesia Preprocedure Evaluation (Addendum)
 Anesthesia Evaluation  Patient identified by MRN, date of birth, ID band Patient awake    Reviewed: Allergy & Precautions, H&P , NPO status , Patient's Chart, lab work & pertinent test results  Airway Mallampati: III  TM Distance: >3 FB Neck ROM: Full    Dental no notable dental hx.    Pulmonary sleep apnea    Pulmonary exam normal breath sounds clear to auscultation       Cardiovascular hypertension, + Past MI  Normal cardiovascular exam Rhythm:Regular Rate:Normal  MI 15 years ago LBBB   Neuro/Psych negative neurological ROS  negative psych ROS   GI/Hepatic negative GI ROS, Neg liver ROS,,,  Endo/Other  negative endocrine ROS    Renal/GU negative Renal ROS  negative genitourinary   Musculoskeletal negative musculoskeletal ROS (+)    Abdominal   Peds negative pediatric ROS (+)  Hematology negative hematology ROS (+)   Anesthesia Other Findings   Reproductive/Obstetrics negative OB ROS                              Anesthesia Physical Anesthesia Plan  ASA: 3  Anesthesia Plan: MAC   Post-op Pain Management:    Induction:   PONV Risk Score and Plan:   Airway Management Planned: Nasal Cannula  Additional Equipment:   Intra-op Plan:   Post-operative Plan:   Informed Consent: I have reviewed the patients History and Physical, chart, labs and discussed the procedure including the risks, benefits and alternatives for the proposed anesthesia with the patient or authorized representative who has indicated his/her understanding and acceptance.     Dental advisory given  Plan Discussed with: CRNA  Anesthesia Plan Comments:          Anesthesia Quick Evaluation

## 2024-06-26 NOTE — Discharge Instructions (Signed)
 Please discharge patient when stable, will follow up today with Dr. Ilsa Iha at the [facility] office immediately following discharge.  Leave shield in place until visit.  All paperwork with discharge instructions will be given at the office.  [facility] Address:  [address]  [place], [place] [address]  Dr. Chaya Jan Phone: [phone]

## 2024-06-26 NOTE — Transfer of Care (Signed)
 Immediate Anesthesia Transfer of Care Note  Patient: Rodney Moreno  Procedure(s) Performed: PHACOEMULSIFICATION, CATARACT, WITH IOL INSERTION (Right: Eye)  Patient Location: Short Stay  Anesthesia Type:MAC  Level of Consciousness: awake, alert , and oriented  Airway & Oxygen Therapy: Patient Spontanous Breathing  Post-op Assessment: Report given to RN and Post -op Vital signs reviewed and stable  Post vital signs: Reviewed and stable  Last Vitals:  Vitals Value Taken Time  BP 124/75 06/26/24 07:50  Temp 36.6 C 06/26/24 07:50  Pulse 61 06/26/24 07:50  Resp 17 06/26/24 07:50  SpO2 99 % 06/26/24 07:50    Last Pain:  Vitals:   06/26/24 0750  TempSrc: Oral  PainSc: 0-No pain         Complications: No notable events documented.

## 2024-06-26 NOTE — Interval H&P Note (Signed)
 History and Physical Interval Note:  06/26/2024 7:14 AM  Rodney Moreno  has presented today for surgery, with the diagnosis of nuclear sclerotic cataract, right eye.  The various methods of treatment have been discussed with the patient and family. After consideration of risks, benefits and other options for treatment, the patient has consented to  Procedure(s): PHACOEMULSIFICATION, CATARACT, WITH IOL INSERTION (Right) as a surgical intervention.  The patient's history has been reviewed, patient examined, no change in status, stable for surgery.  I have reviewed the patient's chart and labs.  Questions were answered to the patient's satisfaction.    The H and P was reviewed and updated. The patient was examined.  No changes were found after exam.  The surgical eye was marked.   [name]

## 2024-06-26 NOTE — Anesthesia Postprocedure Evaluation (Signed)
 Anesthesia Post Note  Patient: Rodney Moreno  Procedure(s) Performed: PHACOEMULSIFICATION, CATARACT, WITH IOL INSERTION (Right: Eye)  Patient location during evaluation: PACU Anesthesia Type: MAC Level of consciousness: awake and alert Pain management: pain level controlled Vital Signs Assessment: post-procedure vital signs reviewed and stable Respiratory status: spontaneous breathing, nonlabored ventilation, respiratory function stable and patient connected to nasal cannula oxygen Cardiovascular status: stable and blood pressure returned to baseline Postop Assessment: no apparent nausea or vomiting Anesthetic complications: no   No notable events documented.   Last Vitals:  Vitals:   06/26/24 0634 06/26/24 0750  BP: 123/75 124/75  Pulse: 70 61  Resp: 17 17  Temp: 36.7 C 36.6 C  SpO2: 98% 99%    Last Pain:  Vitals:   06/26/24 0750  TempSrc: Oral  PainSc: 0-No pain                 Andrea Limes

## 2024-06-27 ENCOUNTER — Encounter (HOSPITAL_COMMUNITY): Payer: Self-pay | Admitting: Optometry

## 2024-07-08 ENCOUNTER — Ambulatory Visit (HOSPITAL_COMMUNITY): Admission: RE | Admit: 2024-07-08 | Source: Ambulatory Visit

## 2024-07-08 ENCOUNTER — Encounter (HOSPITAL_COMMUNITY): Payer: Self-pay

## 2024-07-08 ENCOUNTER — Ambulatory Visit (HOSPITAL_COMMUNITY)
Admission: RE | Admit: 2024-07-08 | Discharge: 2024-07-08 | Disposition: A | Discharge status: Home / Self Care | Source: Ambulatory Visit | Attending: Thoracic Surgery (Cardiothoracic Vascular Surgery) | Admitting: Thoracic Surgery (Cardiothoracic Vascular Surgery)

## 2024-07-08 DIAGNOSIS — I7121 Aneurysm of the ascending aorta, without rupture: Secondary | ICD-10-CM | POA: Insufficient documentation

## 2024-07-08 MED ORDER — IOHEXOL 350 MG/ML SOLN
80.0000 mL | Freq: Once | INTRAVENOUS | Status: AC | PRN
  Administered 2024-07-08: 75 mL via INTRAVENOUS

## 2024-07-11 ENCOUNTER — Encounter (HOSPITAL_COMMUNITY)
Admission: RE | Admit: 2024-07-11 | Discharge: 2024-07-11 | Disposition: A | Discharge status: Home / Self Care | Source: Ambulatory Visit | Attending: Optometry | Admitting: Optometry

## 2024-07-12 NOTE — H&P (Signed)
 Surgical History & Physical  Patient Name: Rodney Moreno  DOB: [birth year]  Surgery: Cataract extraction with intraocular lens implant phacoemulsification; Left Eye Surgeon: Marsa Cleverly MD Surgery Date: 07/16/2024 Pre-Op Date: 07/02/2024  HPI: A 39 Yr. old male patient 1.  The patient is returning for a cataract follow-up of the right eye. Since the last visit, the affected area is doing well. The patient's vision is stable. The condition's severity is constant. Patient is following medication instructions. 2. Pt. is ready to proceed with cataract surgery on OS, due to blurred vision, which is negatively affecting the patient's quality of life and the patient is unable to function adequately in life with the current level of vision. HPI was performed by Marsa Cleverly .  Medical History: Cataracts  Arthritis Cancer High Blood Pressure Thyroid  Problems Ulcers  Review of Systems Cardiovascular High Blood Pressure Endocrine Thyroid  Nodules Eyes Cataracts Genitourinary Bladder Cancer Neurological Aneurisms All recorded systems are negative except as noted above.  Social Never smoked  Alcohol Former Drinker  Medication Prednisolone-moxiflox-bromfen,  Acetaminophen -codeine, Doxycycline hyclate, Amlodipine , Allopurinol, Tamsulosin, Evolocumab  140MG , Hydrochlorothiazide , Valsartan , Clonidine hcl, Losartan, Alirocumab  150MG , Aspirin  Sx/Procedures Phaco c IOL OD,  Bladder surgery (for cancer), Butterfly on spine  Drug Allergies  NKDA  History & Physical: Heent: cataract NECK: supple without bruits LUNGS: lungs clear to auscultation CV: regular rate and rhythm Abdomen: soft and non-tender  Impression & Plan: Assessment: 1.  CATARACT NUCLEAR SCLEROSIS AGE RELATED; Both Eyes (H25.13) 2.  CATARACT EXTRACTION STATUS; Right Eye (Z98.41) 3.  INTRAOCULAR LENS IOL (Z96.1)  Plan: 1.  Cataracts are visually significant and account for the patient's complaints. Discussed all  risks, benefits, procedures and recovery, including infection, loss of vision and eye, need for glasses after surgery or additional procedures. Patient understands changing glasses will not improve vision. Patient indicated understanding of procedure. All questions answered. Patient desires to have surgery, recommend phacoemulsification with intraocular lens. Patient to have preliminary testing necessary ([employer]os/IOL Master, Mac OCT, TOPO) Educational materials provided:Cataract.  Plan: - Proceed with cataract surgery OS when ready - Plan for best distance target with DIB00 - No DM, no fuchs, no prior eye surgery - good dilation on Tamsulosin - discussed low astigmatism and may need glasses afterwards - Dextenza  if available  2.  06/26/24 CE/PCIOL OD. Doing well. All post-op precautions discussed and instructions reviewed. Written instructions given.  3.  See above

## 2024-07-16 ENCOUNTER — Ambulatory Visit (HOSPITAL_COMMUNITY): Admitting: Certified Registered Nurse Anesthetist

## 2024-07-16 ENCOUNTER — Encounter (HOSPITAL_COMMUNITY): Payer: Self-pay | Admitting: Optometry

## 2024-07-16 ENCOUNTER — Other Ambulatory Visit: Payer: Self-pay

## 2024-07-16 ENCOUNTER — Ambulatory Visit (HOSPITAL_COMMUNITY)
Admission: RE | Admit: 2024-07-16 | Discharge: 2024-07-16 | Disposition: A | Discharge status: Home / Self Care | Attending: Optometry | Admitting: Optometry

## 2024-07-16 ENCOUNTER — Encounter (HOSPITAL_COMMUNITY)
Admission: RE | Disposition: A | Discharge status: Home / Self Care | Payer: Self-pay | Source: Home / Self Care | Attending: Optometry

## 2024-07-16 DIAGNOSIS — Z6839 Body mass index (BMI) 39.0-39.9, adult: Secondary | ICD-10-CM | POA: Insufficient documentation

## 2024-07-16 DIAGNOSIS — H2512 Age-related nuclear cataract, left eye: Secondary | ICD-10-CM | POA: Insufficient documentation

## 2024-07-16 DIAGNOSIS — I447 Left bundle-branch block, unspecified: Secondary | ICD-10-CM | POA: Diagnosis not present

## 2024-07-16 DIAGNOSIS — I252 Old myocardial infarction: Secondary | ICD-10-CM | POA: Diagnosis not present

## 2024-07-16 DIAGNOSIS — I10 Essential (primary) hypertension: Secondary | ICD-10-CM

## 2024-07-16 DIAGNOSIS — E66813 Obesity, class 3: Secondary | ICD-10-CM | POA: Insufficient documentation

## 2024-07-16 DIAGNOSIS — Z961 Presence of intraocular lens: Secondary | ICD-10-CM | POA: Diagnosis not present

## 2024-07-16 DIAGNOSIS — G4730 Sleep apnea, unspecified: Secondary | ICD-10-CM | POA: Diagnosis not present

## 2024-07-16 DIAGNOSIS — Z9841 Cataract extraction status, right eye: Secondary | ICD-10-CM | POA: Insufficient documentation

## 2024-07-16 HISTORY — PX: CATARACT EXTRACTION W/PHACO: SHX586

## 2024-07-16 SURGERY — PHACOEMULSIFICATION, CATARACT, WITH IOL INSERTION
Anesthesia: Monitor Anesthesia Care | Site: Eye | Laterality: Left

## 2024-07-16 MED ORDER — SIGHTPATH DOSE#1 NA HYALUR & NA CHOND-NA HYALUR IO KIT
PACK | INTRAOCULAR | Status: DC | PRN
  Administered 2024-07-16: 1 via OPHTHALMIC

## 2024-07-16 MED ORDER — MIDAZOLAM HCL 2 MG/2ML IJ SOLN
INTRAMUSCULAR | Status: AC
  Filled 2024-07-16: qty 2

## 2024-07-16 MED ORDER — PHENYLEPHRINE-KETOROLAC 1-0.3 % IO SOLN
INTRAOCULAR | Status: DC | PRN
  Administered 2024-07-16: 500 mL via OPHTHALMIC

## 2024-07-16 MED ORDER — LIDOCAINE HCL 3.5 % OP GEL
1.0000 | Freq: Once | OPHTHALMIC | Status: AC
  Administered 2024-07-16: 1 via OPHTHALMIC

## 2024-07-16 MED ORDER — STERILE WATER FOR IRRIGATION IR SOLN
Status: DC | PRN
  Administered 2024-07-16: 1

## 2024-07-16 MED ORDER — ACETAMINOPHEN 500 MG PO TABS
1000.0000 mg | ORAL_TABLET | Freq: Once | ORAL | Status: AC
  Administered 2024-07-16: 1000 mg via ORAL

## 2024-07-16 MED ORDER — MOXIFLOXACIN HCL 5 MG/ML IO SOLN
INTRAOCULAR | Status: DC | PRN
  Administered 2024-07-16: .2 mL via INTRACAMERAL

## 2024-07-16 MED ORDER — SODIUM CHLORIDE 0.9% FLUSH
INTRAVENOUS | Status: DC | PRN
  Administered 2024-07-16: 10 mL via INTRAVENOUS

## 2024-07-16 MED ORDER — LIDOCAINE HCL (PF) 1 % IJ SOLN
INTRAMUSCULAR | Status: DC | PRN
Start: 2024-07-16 — End: 2024-07-16
  Administered 2024-07-16: 1 mL

## 2024-07-16 MED ORDER — BSS IO SOLN
INTRAOCULAR | Status: DC | PRN
  Administered 2024-07-16: 15 mL via INTRAOCULAR

## 2024-07-16 MED ORDER — TROPICAMIDE 1 % OP SOLN
1.0000 [drp] | OPHTHALMIC | Status: AC | PRN
  Administered 2024-07-16 (×3): 1 [drp] via OPHTHALMIC

## 2024-07-16 MED ORDER — LACTATED RINGERS IV SOLN: INTRAVENOUS | Status: DC

## 2024-07-16 MED ORDER — ACETAMINOPHEN 160 MG/5ML PO SOLN
960.0000 mg | Freq: Once | ORAL | Status: AC
  Filled 2024-07-16: qty 30

## 2024-07-16 MED ORDER — ACETAMINOPHEN 500 MG PO TABS
ORAL_TABLET | ORAL | Status: AC
  Filled 2024-07-16: qty 2

## 2024-07-16 MED ORDER — PHENYLEPHRINE HCL 2.5 % OP SOLN
1.0000 [drp] | OPHTHALMIC | Status: AC | PRN
  Administered 2024-07-16 (×3): 1 [drp] via OPHTHALMIC

## 2024-07-16 MED ORDER — MIDAZOLAM HCL (PF) 2 MG/2ML IJ SOLN
INTRAMUSCULAR | Status: DC | PRN
  Administered 2024-07-16: 2 mg via INTRAVENOUS

## 2024-07-16 MED ORDER — TETRACAINE HCL 0.5 % OP SOLN
1.0000 [drp] | OPHTHALMIC | Status: AC | PRN
  Administered 2024-07-16 (×3): 1 [drp] via OPHTHALMIC

## 2024-07-16 MED ORDER — POVIDONE-IODINE 5 % OP SOLN
OPHTHALMIC | Status: DC | PRN
  Administered 2024-07-16: 1 via OPHTHALMIC

## 2024-07-16 SURGICAL SUPPLY — 12 items
CLOTH BEACON ORANGE TIMEOUT ST (SAFETY) ×1 IMPLANT
DRSG TEGADERM 4X4.75 (GAUZE/BANDAGES/DRESSINGS) ×1 IMPLANT
EYE SHIELD UNIVERSAL CLEAR (GAUZE/BANDAGES/DRESSINGS) IMPLANT
FEE CATARACT SUITE SIGHTPATH (MISCELLANEOUS) ×1 IMPLANT
GLOVE BIOGEL PI IND STRL 7.0 (GLOVE) ×2 IMPLANT
LENS IOL TECNIS EYHANCE 20.0 (Intraocular Lens) IMPLANT
NDL HYPO 18GX1.5 BLUNT FILL (NEEDLE) ×1 IMPLANT
NEEDLE HYPO 18GX1.5 BLUNT FILL (NEEDLE) ×1 IMPLANT
PAD ARMBOARD POSITIONER FOAM (MISCELLANEOUS) ×1 IMPLANT
SYR TB 1ML LL NO SAFETY (SYRINGE) ×1 IMPLANT
TAPE SURG TRANSPORE 1 IN (GAUZE/BANDAGES/DRESSINGS) IMPLANT
WATER STERILE IRR 250ML POUR (IV SOLUTION) ×1 IMPLANT

## 2024-07-16 NOTE — Op Note (Signed)
 Date of procedure: 07/16/24  Pre-operative diagnosis: Visually significant age-related nuclear cataract, Left Eye (H25.12)  Post-operative diagnosis: Visually significant age-related nuclear cataract, Left Eye H25.12  Procedure: Removal of cataract via phacoemulsification and insertion of intra-ocular lens J&J DIB00 +20.0D into the capsular bag of the Left Eye  Attending surgeon: Marsa JINNY Cleverly, MD  Anesthesia: MAC, Topical Akten  Complications: None  Estimated Blood Loss: <39mL (minimal)  Specimens: None  Implants:  Implant Name Type Inv. Item Serial No. Manufacturer Lot No. LRB No. Used Action  LENS IOL TECNIS EYHANCE 20.0 - [id number] Intraocular Lens LENS IOL TECNIS EYHANCE 20.0 [id number] SIGHTPATH  Left 1 Implanted    Indications:  Visually significant age-related cataract, Left Eye  Procedure:  The patient was seen and identified in the pre-operative area. The operative eye was identified and dilated.  The operative eye was marked.  Topical anesthesia was administered to the operative eye.     The patient was then to the operative suite and placed in the supine position.  A timeout was performed confirming the patient, procedure to be performed, and all other relevant information.   The patient's face was prepped and draped in the usual fashion for intra-ocular surgery.  A lid speculum was placed into the operative eye and the surgical microscope moved into place and focused.  An inferotemporal paracentesis was created using a 20 gauge paracentesis blade.  BSS mixed with Omidria, followed by 1% lidocaine  was injected into the anterior chamber.  Viscoelastic was injected into the anterior chamber.  A temporal clear-corneal main wound incision was created using a 2.48mm microkeratome.  A continuous curvilinear capsulorrhexis was initiated using an irrigating cystitome and completed using capsulorrhexis forceps.  Hydrodissection and hydrodeliniation were performed.  Viscoelastic  was injected into the anterior chamber.  A phacoemulsification handpiece and a chopper as a second instrument were used to remove the nucleus and epinucleus. The irrigation/aspiration handpiece was used to remove any remaining cortical material.   The capsular bag was reinflated with viscoelastic, checked, and found to be intact.  The intraocular lens was inserted into the capsular bag.  The irrigation/aspiration handpiece was used to remove any remaining viscoelastic.  The clear corneal wound and paracentesis wounds were then hydrated and checked with Weck-Cels to be watertight. Moxifloxacin was instilled into the anterior chamber.  The lid-speculum and drape were removed. The patient's face was cleaned with a wet and dry 4x4.  A clear shield was taped over the eye. The patient was taken to the post-operative care unit in good condition, having tolerated the procedure well.  Post-Op Instructions: The patient will follow up at [facility] for a same day post-operative evaluation and will receive all other orders and instructions.

## 2024-07-16 NOTE — Interval H&P Note (Signed)
 History and Physical Interval Note:  07/16/2024 9:09 AM  Rodney Moreno  has presented today for surgery, with the diagnosis of nuclear sclerotic cataract, left eye.  The various methods of treatment have been discussed with the patient and family. After consideration of risks, benefits and other options for treatment, the patient has consented to  Procedure(s): PHACOEMULSIFICATION, CATARACT, WITH IOL INSERTION (Left) as a surgical intervention.  The patient's history has been reviewed, patient examined, no change in status, stable for surgery.  I have reviewed the patient's chart and labs.  Questions were answered to the patient's satisfaction.    The H and P was reviewed and updated. The patient was examined.  No changes were found after exam.  The surgical eye was marked.    Avion Kutzer

## 2024-07-16 NOTE — Anesthesia Postprocedure Evaluation (Signed)
 Anesthesia Post Note  Patient: Rodney Moreno  Procedure(s) Performed: PHACOEMULSIFICATION, CATARACT, WITH IOL INSERTION (Left: Eye)  Patient location during evaluation: Endoscopy Anesthesia Type: MAC Level of consciousness: awake and alert Pain management: pain level controlled Vital Signs Assessment: post-procedure vital signs reviewed and stable Respiratory status: spontaneous breathing, nonlabored ventilation and respiratory function stable Cardiovascular status: stable Anesthetic complications: no   There were no known notable events for this encounter.   Last Vitals:  Vitals:   07/16/24 0913 07/16/24 1000  BP: 111/66 115/79  Pulse: 77 72  Resp: 15 16  Temp: 36.5 C 36.6 C  SpO2: 98% 96%    Last Pain:  Vitals:   07/16/24 1000  TempSrc: Oral  PainSc: 0-No pain                 Jayleigh Notarianni L Taela Charbonneau

## 2024-07-16 NOTE — Anesthesia Preprocedure Evaluation (Signed)
 Anesthesia Evaluation  Patient identified by MRN, date of birth, ID band Patient awake    Reviewed: Allergy & Precautions, H&P , NPO status , Patient's Chart, lab work & pertinent test results  Airway Mallampati: III  TM Distance: >3 FB Neck ROM: Full    Dental no notable dental hx. (+) Dental Advisory Given, Teeth Intact   Pulmonary sleep apnea    Pulmonary exam normal breath sounds clear to auscultation       Cardiovascular hypertension, + Past MI  Normal cardiovascular exam Rhythm:Regular Rate:Normal  MI 15 years ago LBBB  Aortic aneurysm   Neuro/Psych negative neurological ROS  negative psych ROS   GI/Hepatic negative GI ROS, Neg liver ROS,,,  Endo/Other    Class 3 obesity  Renal/GU Bladder cancer  negative genitourinary   Musculoskeletal negative musculoskeletal ROS (+)    Abdominal   Peds negative pediatric ROS (+)  Hematology negative hematology ROS (+)   Anesthesia Other Findings   Reproductive/Obstetrics negative OB ROS                              Anesthesia Physical Anesthesia Plan  ASA: 3  Anesthesia Plan: MAC   Post-op Pain Management: Minimal or no pain anticipated and Tylenol  PO (pre-op)*   Induction:   PONV Risk Score and Plan: Midazolam   Airway Management Planned: Nasal Cannula and Natural Airway  Additional Equipment: None  Intra-op Plan:   Post-operative Plan:   Informed Consent: I have reviewed the patients History and Physical, chart, labs and discussed the procedure including the risks, benefits and alternatives for the proposed anesthesia with the patient or authorized representative who has indicated his/her understanding and acceptance.     Dental advisory given  Plan Discussed with: CRNA  Anesthesia Plan Comments:          Anesthesia Quick Evaluation

## 2024-07-16 NOTE — Transfer of Care (Signed)
 Immediate Anesthesia Transfer of Care Note  Patient: Rodney Moreno  Procedure(s) Performed: PHACOEMULSIFICATION, CATARACT, WITH IOL INSERTION (Left: Eye)  Patient Location: Short Stay  Anesthesia Type:MAC  Level of Consciousness: awake, alert , and oriented  Airway & Oxygen Therapy: Patient Spontanous Breathing  Post-op Assessment: Report given to RN and Post -op Vital signs reviewed and stable  Post vital signs: Reviewed and stable  Last Vitals:  Vitals Value Taken Time  BP 115/79   Temp    Pulse 73   Resp    SpO2 97%     Last Pain:  Vitals:   07/16/24 0920  TempSrc:   PainSc: 0-No pain         Complications: No notable events documented.

## 2024-07-16 NOTE — Discharge Instructions (Signed)
 Please discharge patient when stable, will follow up today with Dr. Ilsa Iha at the [facility] office immediately following discharge.  Leave shield in place until visit.  All paperwork with discharge instructions will be given at the office.  [facility] Address:  [address]  [place], [place] [address]  Dr. Chaya Jan Phone: [phone]

## 2024-07-17 ENCOUNTER — Encounter (HOSPITAL_COMMUNITY): Payer: Self-pay | Admitting: Optometry

## 2024-07-18 ENCOUNTER — Ambulatory Visit

## 2024-07-18 VITALS — BP 116/72 | HR 89 | Resp 20 | Ht 70.0 in | Wt 280.0 lb

## 2024-07-18 DIAGNOSIS — I7121 Aneurysm of the ascending aorta, without rupture: Secondary | ICD-10-CM

## 2024-07-18 NOTE — Progress Notes (Signed)
 [address] [place] [address]             [phone]            Rodney Moreno [id number] 1951-03-27   History of Present Illness:  Rodney Moreno is a 73 year old man with medical history of hypertension, OSA, bladder cancer and hyperlipidemia who presents for 6 month follow up of ascending thoracic aortic aneurysm.  He has been followed by our clinic since 2023.  Echocardiogram in 2022 showed normal structure aortic valve.  On recent CTA of chest on 07/08/2024 aneurysm measured 4.8 cm.   He presents to the clinic and reports that he has been doing well. His blood pressure is controlled with current medications.  He does [place] that [place] his blood pressure readings increase.  He takes clonidine as needed when traveling for this reason.  He denies chest pain, shortness of breath and lower leg swelling.     Current Outpatient Medications on File Prior to Visit  Medication Sig Dispense Refill   acetaminophen -codeine (TYLENOL  #4) 300-60 MG tablet Take by mouth.     allopurinol (ZYLOPRIM) 300 MG tablet Take 300 mg by mouth daily.     amLODipine  (NORVASC ) 5 MG tablet Take 1 tablet (5 mg total) by mouth daily. 90 tablet 1   aspirin EC 81 MG tablet Take 81 mg by mouth daily. Swallow whole.     cloNIDine (CATAPRES) 0.1 MG tablet Take 1 tablet by mouth daily as needed.     Evolocumab  (REPATHA  SURECLICK) 140 MG/ML SOAJ Inject 140 mg into the skin every 14 (fourteen) days. 6 mL 3   valsartan -hydrochlorothiazide  (DIOVAN  HCT) 320-12.5 MG tablet Take 1 tablet by mouth daily. 90 tablet 3   No current facility-administered medications on file prior to visit.     ROS: Review of Systems  Constitutional: Negative.  Negative for malaise/fatigue.  Respiratory: Negative.  Negative for cough and shortness of breath.   Cardiovascular: Negative.  Negative for chest pain and leg swelling.     BP 116/72   Pulse 89   Resp 20    Ht 5' 10 (1.778 m)   Wt 280 lb (127 kg)   SpO2 95% Comment: RA  BMI 40.18 kg/m   Physical Exam Constitutional:      Appearance: Normal appearance.  HENT:     Head: Normocephalic and atraumatic.  Skin:    General: Skin is warm and dry.  Neurological:     General: No focal deficit present.     Mental Status: He is alert and oriented to person, place, and time.      Imaging: CLINICAL DATA:  Follow-up ascending aortic aneurysm.   EXAM: CT ANGIOGRAPHY CHEST WITH CONTRAST   TECHNIQUE: Multidetector CT imaging of the chest was performed using the standard protocol during bolus administration of intravenous contrast. Multiplanar CT image reconstructions and MIPs were obtained to evaluate the vascular anatomy.   RADIATION DOSE REDUCTION: This exam was performed according to the departmental dose-optimization program which includes automated exposure control, adjustment of the mA and/or kV according to patient size and/or use of iterative reconstruction technique.   CONTRAST:  75mL OMNIPAQUE  IOHEXOL  350 MG/ML SOLN   COMPARISON:  Chest CT dated 01/03/2024.   FINDINGS: Cardiovascular: There is no cardiomegaly or pericardial effusion. There is 3 vessel coronary vascular calcification. Dilatation of the ascending aorta measuring 4.8 cm in  maximal diameter. No aortic dissection. There is mild atherosclerotic calcification of the thoracic aorta. The central pulmonary arteries appear patent.   Mediastinum/Nodes: No hilar or mediastinal adenopathy. The esophagus is grossly unremarkable. A 3.5 cm left thyroid  nodule. This has been evaluated on previous imaging. (ref: J Am Coll Radiol. 2015 Feb;12(2): 143-50).No mediastinal fluid collection.   Lungs/Pleura: No focal consolidation, pleural effusion, or pneumothorax. The central airways are patent.   Upper Abdomen: Fatty liver.   Musculoskeletal: Degenerative changes of the spine. No acute osseous pathology.   Review of the MIP  images confirms the above findings.   IMPRESSION: 1. A 4.8 cm ascending aortic aneurysm. Ascending thoracic aortic aneurysm. Recommend semi-annual imaging followup by CTA or MRA and referral to cardiothoracic surgery if not already obtained. This recommendation follows 2010 ACCF/AHA/AATS/ACR/ASA/SCA/SCAI/SIR/STS/SVM Guidelines for the Diagnosis and Management of Patients With Thoracic Aortic Disease. Circulation. 2010; 121: Z733-z630. Aortic aneurysm NOS (ICD10-I71.9) 2.  Aortic Atherosclerosis (ICD10-I70.0).     Electronically Signed   By: Vanetta Chou M.D.   On: 07/08/2024 16:07     A/P:  Aneurysm of ascending aorta without rupture -4.8 cm ascending thoracic aortic aneurysm on CTA of chest.  -We discussed the natural history and and risk factors for growth of ascending aortic aneurysms. Discussed recommendations to minimize the risk of further expansion or dissection including careful blood pressure control, avoidance of contact sports and heavy lifting, attention to lipid management.  We covered the importance of staying never user of tobacco.  The patient does not yet meet surgical criteria of >5.5cm. The patient is aware of signs and symptoms of aortic dissection and when to present to the emergency department   -Follow up in 6 months with CTA of chest for continued surveillance   Risk Modification:  Statin:  Repatha  for cholesterol   Smoking cessation instruction/counseling given:  never user  Patient was counseled on importance of Blood Pressure Control  They are instructed to contact their Primary Care Physician if they start to have blood pressure readings over 130s/90s. Do not ever stop blood pressure medications on your own, unless instructed by healthcare professional.  Please avoid use of Fluoroquinolones as this can potentially increase your risk of Aortic Rupture and/or Dissection  Patient educated on signs and symptoms of Aortic Dissection, handout also  provided in AVS  Rodney CHRISTELLA Rough, PA-C 07/18/24

## 2024-07-18 NOTE — Patient Instructions (Signed)

## 2024-08-12 NOTE — Progress Notes (Unsigned)
 No show

## 2024-08-13 ENCOUNTER — Ambulatory Visit: Admitting: Physician Assistant

## 2024-09-05 ENCOUNTER — Encounter (HOSPITAL_BASED_OUTPATIENT_CLINIC_OR_DEPARTMENT_OTHER): Payer: Self-pay

## 2024-09-05 ENCOUNTER — Ambulatory Visit (HOSPITAL_BASED_OUTPATIENT_CLINIC_OR_DEPARTMENT_OTHER): Admit: 2024-09-05 | Admitting: Plastic Surgery

## 2024-09-05 SURGERY — BLEPHAROPLASTY
Anesthesia: Choice | Site: Eye | Laterality: Bilateral

## 2024-09-13 ENCOUNTER — Encounter: Admitting: Plastic Surgery

## 2024-09-23 ENCOUNTER — Encounter: Admitting: Physician Assistant

## 2024-09-25 ENCOUNTER — Encounter: Admitting: Student

## 2024-10-07 ENCOUNTER — Other Ambulatory Visit: Admitting: Urology

## 2024-10-07 ENCOUNTER — Encounter: Admitting: Physician Assistant

## 2024-10-09 ENCOUNTER — Encounter: Admitting: Student

## 2024-10-11 ENCOUNTER — Encounter: Admitting: Plastic Surgery

## 2024-10-21 ENCOUNTER — Other Ambulatory Visit: Admitting: Urology

## 2024-11-01 ENCOUNTER — Encounter: Admitting: Student

## 2024-11-20 ENCOUNTER — Encounter (HOSPITAL_BASED_OUTPATIENT_CLINIC_OR_DEPARTMENT_OTHER): Payer: Self-pay

## 2024-11-20 ENCOUNTER — Ambulatory Visit (HOSPITAL_BASED_OUTPATIENT_CLINIC_OR_DEPARTMENT_OTHER): Admit: 2024-11-20 | Admitting: Plastic Surgery
